# Patient Record
Sex: Female | Born: 1935 | Race: White | Hispanic: No | Smoking: Never smoker
Health system: Southern US, Community
[De-identification: ages and names within clinical notes are randomized; demographics above are authoritative.]

## PROBLEM LIST (undated history)

## (undated) DIAGNOSIS — E039 Hypothyroidism, unspecified: Secondary | ICD-10-CM

## (undated) DIAGNOSIS — N390 Urinary tract infection, site not specified: Secondary | ICD-10-CM

## (undated) DIAGNOSIS — N184 Chronic kidney disease, stage 4 (severe): Secondary | ICD-10-CM

## (undated) DIAGNOSIS — I5030 Unspecified diastolic (congestive) heart failure: Secondary | ICD-10-CM

## (undated) DIAGNOSIS — D638 Anemia in other chronic diseases classified elsewhere: Secondary | ICD-10-CM

## (undated) DIAGNOSIS — I10 Essential (primary) hypertension: Secondary | ICD-10-CM

## (undated) HISTORY — DX: Hypothyroidism, unspecified: E03.9

## (undated) HISTORY — PX: BACK SURGERY: SHX140

## (undated) HISTORY — DX: Unspecified diastolic (congestive) heart failure: I50.30

## (undated) HISTORY — PX: CHOLECYSTECTOMY: SHX55

## (undated) HISTORY — PX: THYROIDECTOMY: SHX17

## (undated) HISTORY — DX: Urinary tract infection, site not specified: N39.0

## (undated) HISTORY — DX: Chronic kidney disease, stage 4 (severe): N18.4

## (undated) HISTORY — PX: TONSILLECTOMY: SUR1361

## (undated) HISTORY — PX: OTHER SURGICAL HISTORY: SHX169

## (undated) HISTORY — PX: APPENDECTOMY: SHX54

## (undated) HISTORY — DX: Anemia in other chronic diseases classified elsewhere: D63.8

## (undated) HISTORY — PX: TOTAL HIP ARTHROPLASTY: SHX124

---

## 2012-02-05 DIAGNOSIS — R55 Syncope and collapse: Secondary | ICD-10-CM | POA: Insufficient documentation

## 2012-02-05 DIAGNOSIS — I10 Essential (primary) hypertension: Secondary | ICD-10-CM | POA: Diagnosis present

## 2015-01-19 DIAGNOSIS — N390 Urinary tract infection, site not specified: Secondary | ICD-10-CM | POA: Diagnosis present

## 2015-01-19 DIAGNOSIS — M6281 Muscle weakness (generalized): Secondary | ICD-10-CM | POA: Insufficient documentation

## 2015-01-24 DIAGNOSIS — R079 Chest pain, unspecified: Secondary | ICD-10-CM | POA: Insufficient documentation

## 2015-03-04 DIAGNOSIS — J4520 Mild intermittent asthma, uncomplicated: Secondary | ICD-10-CM | POA: Insufficient documentation

## 2015-03-16 DIAGNOSIS — M533 Sacrococcygeal disorders, not elsewhere classified: Secondary | ICD-10-CM | POA: Insufficient documentation

## 2015-05-18 DIAGNOSIS — M7061 Trochanteric bursitis, right hip: Secondary | ICD-10-CM | POA: Insufficient documentation

## 2015-09-14 DIAGNOSIS — L570 Actinic keratosis: Secondary | ICD-10-CM | POA: Insufficient documentation

## 2015-12-10 ENCOUNTER — Encounter: Payer: Self-pay | Admitting: Emergency Medicine

## 2015-12-10 ENCOUNTER — Emergency Department
Admission: EM | Admit: 2015-12-10 | Discharge: 2015-12-10 | Disposition: A | Discharge status: Home / Self Care | Payer: Medicare Other | Attending: Emergency Medicine | Admitting: Emergency Medicine

## 2015-12-10 DIAGNOSIS — I10 Essential (primary) hypertension: Secondary | ICD-10-CM | POA: Diagnosis not present

## 2015-12-10 DIAGNOSIS — R05 Cough: Secondary | ICD-10-CM | POA: Diagnosis present

## 2015-12-10 DIAGNOSIS — E876 Hypokalemia: Secondary | ICD-10-CM | POA: Diagnosis not present

## 2015-12-10 DIAGNOSIS — K591 Functional diarrhea: Secondary | ICD-10-CM

## 2015-12-10 DIAGNOSIS — J189 Pneumonia, unspecified organism: Secondary | ICD-10-CM | POA: Diagnosis not present

## 2015-12-10 DIAGNOSIS — E860 Dehydration: Secondary | ICD-10-CM | POA: Diagnosis not present

## 2015-12-10 HISTORY — DX: Essential (primary) hypertension: I10

## 2015-12-10 LAB — CBC WITH DIFFERENTIAL/PLATELET
BASOS ABS: 0.1 10*3/uL (ref 0–0.1)
Basophils Relative: 1 %
EOS ABS: 0.4 10*3/uL (ref 0–0.7)
HCT: 35.3 % (ref 35.0–47.0)
Hemoglobin: 11.7 g/dL — ABNORMAL LOW (ref 12.0–16.0)
Lymphocytes Relative: 8 %
Lymphs Abs: 0.6 10*3/uL — ABNORMAL LOW (ref 1.0–3.6)
MCH: 28.7 pg (ref 26.0–34.0)
MCHC: 33.2 g/dL (ref 32.0–36.0)
MCV: 86.5 fL (ref 80.0–100.0)
Monocytes Absolute: 0.8 10*3/uL (ref 0.2–0.9)
Monocytes Relative: 10 %
Neutro Abs: 5.9 10*3/uL (ref 1.4–6.5)
Neutrophils Relative %: 75 %
PLATELETS: 190 10*3/uL (ref 150–440)
RBC: 4.09 MIL/uL (ref 3.80–5.20)
RDW: 15.4 % — AB (ref 11.5–14.5)
WBC: 7.7 10*3/uL (ref 3.6–11.0)

## 2015-12-10 LAB — COMPREHENSIVE METABOLIC PANEL
ALT: 16 U/L (ref 14–54)
AST: 26 U/L (ref 15–41)
Albumin: 3.3 g/dL — ABNORMAL LOW (ref 3.5–5.0)
Alkaline Phosphatase: 41 U/L (ref 38–126)
Anion gap: 13 (ref 5–15)
BUN: 51 mg/dL — AB (ref 6–20)
CHLORIDE: 99 mmol/L — AB (ref 101–111)
CO2: 25 mmol/L (ref 22–32)
CREATININE: 1.82 mg/dL — AB (ref 0.44–1.00)
Calcium: 8.5 mg/dL — ABNORMAL LOW (ref 8.9–10.3)
GFR calc Af Amer: 27 mL/min — ABNORMAL LOW (ref >60)
GFR calc non Af Amer: 23 mL/min — ABNORMAL LOW (ref >60)
Glucose, Bld: 102 mg/dL — ABNORMAL HIGH (ref 65–99)
POTASSIUM: 3 mmol/L — AB (ref 3.5–5.1)
SODIUM: 137 mmol/L (ref 135–145)
Total Bilirubin: 0.9 mg/dL (ref 0.3–1.2)
Total Protein: 6.8 g/dL (ref 6.5–8.1)

## 2015-12-10 MED ORDER — SODIUM CHLORIDE 0.9 % IV BOLUS (SEPSIS)
500.0000 mL | Freq: Once | INTRAVENOUS | Status: AC
  Administered 2015-12-10: 500 mL via INTRAVENOUS

## 2015-12-10 MED ORDER — ALBUTEROL SULFATE (2.5 MG/3ML) 0.083% IN NEBU
2.5000 mg | INHALATION_SOLUTION | Freq: Once | RESPIRATORY_TRACT | Status: AC
  Administered 2015-12-10: 2.5 mg via RESPIRATORY_TRACT
  Filled 2015-12-10: qty 3

## 2015-12-10 MED ORDER — ONDANSETRON 4 MG PO TBDP: 4.0000 mg | ORAL_TABLET | Freq: Three times a day (TID) | ORAL | Status: DC | PRN

## 2015-12-10 MED ORDER — POTASSIUM CHLORIDE CRYS ER 20 MEQ PO TBCR
40.0000 meq | EXTENDED_RELEASE_TABLET | Freq: Once | ORAL | Status: AC
  Administered 2015-12-10: 40 meq via ORAL
  Filled 2015-12-10: qty 2

## 2015-12-10 MED ORDER — GUAIFENESIN 100 MG/5ML PO SOLN: 5.0000 mL | ORAL | Status: DC | PRN

## 2015-12-10 NOTE — Discharge Instructions (Signed)
Community-Acquired Pneumonia, Adult Pneumonia is an infection of the lungs. There are different types of pneumonia. One type can develop while a person is in a hospital. A different type, called community-acquired pneumonia, develops in people who are not, or have not recently been, in the hospital or other health care facility.  CAUSES Pneumonia may be caused by bacteria, viruses, or funguses. Community-acquired pneumonia is often caused by Streptococcus pneumonia bacteria. These bacteria are often passed from one person to another by breathing in droplets from the cough or sneeze of an infected person. RISK FACTORS The condition is more likely to develop in:  People who havechronic diseases, such as chronic obstructive pulmonary disease (COPD), asthma, congestive heart failure, cystic fibrosis, diabetes, or kidney disease.  People who haveearly-stage or late-stage HIV.  People who havesickle cell disease.  People who havehad their spleen removed (splenectomy).  People who havepoor Human resources officer.  People who havemedical conditions that increase the risk of breathing in (aspirating) secretions their own mouth and nose.   People who havea weakened immune system (immunocompromised).  People who smoke.  People whotravel to areas where pneumonia-causing germs commonly exist.  People whoare around animal habitats or animals that have pneumonia-causing germs, including birds, bats, rabbits, cats, and farm animals. SYMPTOMS Symptoms of this condition include:  Adry cough.  A wet (productive) cough.  Fever.  Sweating.  Chest pain, especially when breathing deeply or coughing.  Rapid breathing or difficulty breathing.  Shortness of breath.  Shaking chills.  Fatigue.  Muscle aches. DIAGNOSIS Your health care provider will take a medical history and perform a physical exam. You may also have other tests, including:  Imaging studies of your chest, including  X-rays.  Tests to check your blood oxygen level and other blood gases.  Other tests on blood, mucus (sputum), fluid around your lungs (pleural fluid), and urine. If your pneumonia is severe, other tests may be done to identify the specific cause of your illness. TREATMENT The type of treatment that you receive depends on many factors, such as the cause of your pneumonia, the medicines you take, and other medical conditions that you have. For most adults, treatment and recovery from pneumonia may occur at home. In some cases, treatment must happen in a hospital. Treatment may include:  Antibiotic medicines, if the pneumonia was caused by bacteria.  Antiviral medicines, if the pneumonia was caused by a virus.  Medicines that are given by mouth or through an IV tube.  Oxygen.  Respiratory therapy. Although rare, treating severe pneumonia may include:  Mechanical ventilation. This is done if you are not breathing well on your own and you cannot maintain a safe blood oxygen level.  Thoracentesis. This procedureremoves fluid around one lung or both lungs to help you breathe better. HOME CARE INSTRUCTIONS  Take over-the-counter and prescription medicines only as told by your health care provider.  Only takecough medicine if you are losing sleep. Understand that cough medicine can prevent your body's natural ability to remove mucus from your lungs.  If you were prescribed an antibiotic medicine, take it as told by your health care provider. Do not stop taking the antibiotic even if you start to feel better.  Sleep in a semi-upright position at night. Try sleeping in a reclining chair, or place a few pillows under your head.  Do not use tobacco products, including cigarettes, chewing tobacco, and e-cigarettes. If you need help quitting, ask your health care provider.  Drink enough water to keep your urine  clear or pale yellow. This will help to thin out mucus secretions in your  lungs. PREVENTION There are ways that you can decrease your risk of developing community-acquired pneumonia. Consider getting a pneumococcal vaccine if:  You are older than 80 years of age.  You are older than 80 years of age and are undergoing cancer treatment, have chronic lung disease, or have other medical conditions that affect your immune system. Ask your health care provider if this applies to you. There are different types and schedules of pneumococcal vaccines. Ask your health care provider which vaccination option is best for you. You may also prevent community-acquired pneumonia if you take these actions:  Get an influenza vaccine every year. Ask your health care provider which type of influenza vaccine is best for you.  Go to the dentist on a regular basis.  Wash your hands often. Use hand sanitizer if soap and water are not available. SEEK MEDICAL CARE IF:  You have a fever.  You are losing sleep because you cannot control your cough with cough medicine. SEEK IMMEDIATE MEDICAL CARE IF:  You have worsening shortness of breath.  You have increased chest pain.  Your sickness becomes worse, especially if you are an older adult or have a weakened immune system.  You cough up blood.   This information is not intended to replace advice given to you by your health care provider. Make sure you discuss any questions you have with your health care provider.   Document Released: 07/23/2005 Document Revised: 04/13/2015 Document Reviewed: 11/17/2014 Elsevier Interactive Patient Education 2016 [employer]  Dehydration Dehydration is when you lose more fluids from the body than you take in. Vital organs such as the kidneys, brain, and heart cannot function without a proper amount of fluids and salt. Any loss of fluids from the body can cause dehydration.  Older adults are at a higher risk of dehydration than younger adults. As we age, our bodies are less able to conserve water  and do not respond to temperature changes as well. Also, older adults do not become thirsty as easily or quickly. Because of this, older adults often do not realize they need to increase fluids to avoid dehydration.  CAUSES   Vomiting.  Diarrhea.  Excessive sweating.  Excessive urination.  Fever.  Certain medicines, such as blood pressure medicines called diuretics.  Poorly controlled blood sugars. SIGNS AND SYMPTOMS  Mild dehydration:  Thirst.  Dry lips.  Slightly dry mouth. Moderate dehydration:  Very dry mouth.  Sunken eyes.  Skin does not bounce back quickly when lightly pinched and released.  Dark urine and decreased urine production.  Decreased tear production.  Headache. Severe dehydration:  Very dry mouth.  Extreme thirst.  Rapid, weak pulse (more than 100 beats per minute at rest).  Cold hands and feet.  Not able to sweat in spite of heat.  Rapid breathing.  Blue lips.  Confusion and lethargy.  Difficulty being awakened.  Minimal urine production.  No tears. DIAGNOSIS  Your health care provider will diagnose dehydration based on your symptoms and your exam. Blood and urine tests will help confirm the diagnosis. The diagnostic evaluation should also identify the cause of dehydration. TREATMENT  Treatment of mild or moderate dehydration can often be done at home by increasing the amount of fluids that you drink. It is best to drink small amounts of fluid more often. Drinking too much at one time can make vomiting worse. Severe dehydration needs to be treated  at the hospital. You may be given IV fluids that contain water and electrolytes. HOME CARE INSTRUCTIONS   Ask your health care provider about specific rehydration instructions.  Drink enough fluids to keep your urine clear or pale yellow.  Drink small amounts frequently if you have nausea and vomiting.  Eat as you normally do.  Avoid:  Foods or drinks high in sugar.  Carbonated  drinks.  Juice.  Extremely hot or cold fluids.  Drinks with caffeine.  Fatty, greasy foods.  Alcohol.  Tobacco.  Overeating.  Gelatin desserts.  Wash your hands well to avoid spreading bacteria and viruses.  Only take over-the-counter or prescription medicines for pain, discomfort, or fever as directed by your health care provider.  Ask your health care provider if you should continue all prescribed and over-the-counter medicines.  Keep all follow-up appointments with your health care provider. SEEK MEDICAL CARE IF:  You have abdominal pain, and it increases or stays in one area (localizes).  You have a rash, stiff neck, or severe headache.  You are irritable, sleepy, or difficult to awaken.  You are weak, dizzy, or extremely thirsty.  You have a fever. SEEK IMMEDIATE MEDICAL CARE IF:   You are unable to keep fluids down, or you get worse despite treatment.  You have frequent episodes of vomiting or diarrhea.  You have blood or green matter (bile) in your vomit.  You have blood in your stool, or your stool looks black and tarry.  You have not urinated in 6-8 hours, or you have only urinated a small amount of very dark urine.  You faint. MAKE SURE YOU:   Understand these instructions.  Will watch your condition.  Will get help right away if you are not doing well or get worse.   This information is not intended to replace advice given to you by your health care provider. Make sure you discuss any questions you have with your health care provider.   Document Released: 10/13/2003 Document Revised: 07/28/2013 Document Reviewed: 03/30/2013 Elsevier Interactive Patient Education 2016 [employer]  Diarrhea Diarrhea is watery poop (stool). It can make you feel weak, tired, thirsty, or give you a dry mouth (signs of dehydration). Watery poop is a sign of another problem, most often an infection. It often lasts 2-3 days. It can last longer if it is a sign of  something serious. Take care of yourself as told by your doctor. HOME CARE   Drink 1 cup (8 ounces) of fluid each time you have watery poop.  Do not drink the following fluids:  Those that contain simple sugars (fructose, glucose, galactose, lactose, sucrose, maltose).  Sports drinks.  Fruit juices.  Whole milk products.  Sodas.  Drinks with caffeine (coffee, tea, soda) or alcohol.  Oral rehydration solution may be used if the doctor says it is okay. You may make your own solution. Follow this recipe:   - teaspoon table salt.   teaspoon baking soda.   teaspoon salt substitute containing potassium chloride.  1 tablespoons sugar.  1 liter (34 ounces) of water.  Avoid the following foods:  High fiber foods, such as raw fruits and vegetables.  Nuts, seeds, and whole grain breads and cereals.   Those that are sweetened with sugar alcohols (xylitol, sorbitol, mannitol).  Try eating the following foods:  Starchy foods, such as rice, toast, pasta, low-sugar cereal, oatmeal, baked potatoes, crackers, and bagels.  Bananas.  Applesauce.  Eat probiotic-rich foods, such as yogurt and milk products that  are fermented.  Wash your hands well after each time you have watery poop.  Only take medicine as told by your doctor.  Take a warm bath to help lessen burning or pain from having watery poop. GET HELP RIGHT AWAY IF:   You cannot drink fluids without throwing up (vomiting).  You keep throwing up.  You have blood in your poop, or your poop looks black and tarry.  You do not pee (urinate) in 6-8 hours, or there is only a small amount of very dark pee.  You have belly (abdominal) pain that gets worse or stays in the same spot (localizes).  You are weak, dizzy, confused, or light-headed.  You have a very bad headache.  Your watery poop gets worse or does not get better.  You have a fever or lasting symptoms for more than 2-3 days.  You have a fever and your  symptoms suddenly get worse. MAKE SURE YOU:   Understand these instructions.  Will watch your condition.  Will get help right away if you are not doing well or get worse.   This information is not intended to replace advice given to you by your health care provider. Make sure you discuss any questions you have with your health care provider.   Document Released: 01/09/2008 Document Revised: 08/13/2014 Document Reviewed: 03/30/2012 Elsevier Interactive Patient Education 2016 [employer].  Hypokalemia Hypokalemia means that the amount of potassium in the blood is lower than normal.Potassium is a chemical, called an electrolyte, that helps regulate the amount of fluid in the body. It also stimulates muscle contraction and helps nerves function properly.Most of the body's potassium is inside of cells, and only a very small amount is in the blood. Because the amount in the blood is so small, minor changes can be life-threatening. CAUSES  Antibiotics.  Diarrhea or vomiting.  Using laxatives too much, which can cause diarrhea.  Chronic kidney disease.  Water pills (diuretics).  Eating disorders (bulimia).  Low magnesium level.  Sweating a lot. SIGNS AND SYMPTOMS  Weakness.  Constipation.  Fatigue.  Muscle cramps.  Mental confusion.  Skipped heartbeats or irregular heartbeat (palpitations).  Tingling or numbness. DIAGNOSIS  Your health care provider can diagnose hypokalemia with blood tests. In addition to checking your potassium level, your health care provider may also check other lab tests. TREATMENT Hypokalemia can be treated with potassium supplements taken by mouth or adjustments in your current medicines. If your potassium level is very low, you may need to get potassium through a vein (IV) and be monitored in the hospital. A diet high in potassium is also helpful. Foods high in potassium are:  Nuts, such as peanuts and pistachios.  Seeds, such as sunflower  seeds and pumpkin seeds.  Peas, lentils, and lima beans.  Whole grain and bran cereals and breads.  Fresh fruit and vegetables, such as apricots, avocado, bananas, cantaloupe, kiwi, oranges, tomatoes, asparagus, and potatoes.  Orange and tomato juices.  Red meats.  Fruit yogurt. HOME CARE INSTRUCTIONS  Take all medicines as prescribed by your health care provider.  Maintain a healthy diet by including nutritious food, such as fruits, vegetables, nuts, whole grains, and lean meats.  If you are taking a laxative, be sure to follow the directions on the label. SEEK MEDICAL CARE IF:  Your weakness gets worse.  You feel your heart pounding or racing.  You are vomiting or having diarrhea.  You are diabetic and having trouble keeping your blood glucose in the normal  range. SEEK IMMEDIATE MEDICAL CARE IF:  You have chest pain, shortness of breath, or dizziness.  You are vomiting or having diarrhea for more than 2 days.  You faint. MAKE SURE YOU:   Understand these instructions.  Will watch your condition.  Will get help right away if you are not doing well or get worse.   This information is not intended to replace advice given to you by your health care provider. Make sure you discuss any questions you have with your health care provider.   Document Released: 07/23/2005 Document Revised: 08/13/2014 Document Reviewed: 01/23/2013 Elsevier Interactive Patient Education 2016 [employer] Inc[employer]  Rehydration, Elderly Rehydration is the replacement of body fluids lost during dehydration. Dehydration is an extreme loss of body fluids to the point of body function impairment. There are many ways extreme fluid loss can occur, including vomiting, diarrhea, or excess sweating. Recovering from dehydration requires replacing lost fluids, continuing to eat to maintain strength, and avoiding foods and beverages that may contribute to further fluid loss or may increase nausea. It is especially  important for older adults to stay hydrated because dehydration can lead to dizziness and falls. Some factors that can contribute to dehydration are more common in the elderly, including the use of prescription medicines and a decreased sensation of thirst.  HOW TO REHYDRATE In most cases, rehydration involves the replacement of not only fluids but also carbohydrates and basic body salts. Rehydration with an oral rehydration solution is one way to replace essential nutrients lost through dehydration. An oral rehydration solution can be purchased at pharmacies, retail stores, and online. Premixed packets of powder that you combine with water to make a solution are also sold. You can prepare an oral rehydration solution at home by mixing the following ingredients together:    - tsp table salt.   tsp baking soda.   tsp salt substitute containing potassium chloride.  1 tablespoons sugar.  1 L (34 oz) of water. Be sure to use exact measurements. Including too much sugar can make diarrhea worse. Drink -1 cup (120-240 mL) of oral rehydration solution each time you have diarrhea or vomit. If drinking this amount makes your vomiting worse, try drinking smaller amounts more often. For example, drink 1-3 tsp every 5-10 minutes.  A general rule for staying hydrated is to drink 1-2 L of fluid per day. Talk to your caregiver about the specific amount you should be drinking each day. Drink enough fluids to keep your urine clear or pale yellow. EATING WHEN DEHYDRATED  Even if you have had severe sweating or you are having diarrhea, do not stop eating. Many healthy items in a normal diet are okay to continue eating while recovering from dehydration. The following tips can help you to lessen nausea when you eat:  Ask someone else to prepare your food. Cooking smells may worsen nausea.  Eat in a well-ventilated room away from cooking smells.  Sit up when you eat. Avoid lying down until 1-2 hours after  eating.  Eat small amounts when you eat.  Eat foods that are easy to digest. These include soft, well-cooked, or mashed foods. FOODS AND BEVERAGES TO AVOID Avoid eating or drinking the following foods and beverages that may increase nausea or further loss of fluid:   Fruit juices with a high sugar content, such as concentrated juices.  Alcohol.  Beverages containing caffeine.  Carbonated drinks. They may cause a lot of gas.  Foods that may cause a lot of gas, such  as cabbage, broccoli, and beans.  Fatty, greasy, and fried foods.  Spicy, very salty, and very sweet foods or drinks.  Foods or drinks that are very hot or very cold. Consume food or drinks at or near room temperature.  Foods that need a lot of chewing, such as raw vegetables.  Foods that are sticky or hard to swallow, such as peanut butter.   This information is not intended to replace advice given to you by your health care provider. Make sure you discuss any questions you have with your health care provider.   Document Released: 10/15/2011 Document Revised: 04/16/2012 Document Reviewed: 10/15/2011 Elsevier Interactive Patient Education [employer]

## 2015-12-10 NOTE — ED Notes (Signed)
Pt and family verbalized understanding of discharge instructions. NAD at this time.  

## 2015-12-10 NOTE — ED Provider Notes (Signed)
[facility] Emergency Department Provider Note  ____________________________________________  Time seen: 10:40 AM  I have reviewed the triage vital signs and the nursing notes.   HISTORY  Chief Complaint Cough    HPI Julie Potts is a 82 y.o. female who complains of productive cough and diarrhea.She was seen a primary care 3 days ago and again yesterday for this. She was started on Augmentin. She went to [facility] emergency room yesterday for evaluation, but left without being seen after a 7 hour wait. She did have labs and a chest x-ray performed while waiting in triage. I have reviewed these in the electronic medical record which shows some mild acute renal insufficiency with a creatinine of 1.7 on a baseline of 1.2 and her chest x-ray was unremarkable.  She denies fevers or chills and states that with the Augmentin she overall feels much better. Her doctor also prescribed her an albuterol inhaler. Does not have significant chest pain or shortness of breath at this time but does just still have a nagging cough. Her bigger concern is that with the Augmentin she's been having a lot of diarrhea and feels that she is getting dehydrated. She is eating and drinking normally. Denies abdominal pain.     Past Medical History  Diagnosis Date  . Hypertension      There are no active problems to display for this patient.    Past Surgical History  Procedure Laterality Date  . Cholecystectomy    . Thyroidectomy    . Knee repacement    . Total hip arthroplasty    . Appendectomy    . Tonsillectomy    . Back surgery       Current Outpatient Rx  Name  Route  Sig  Dispense  Refill  . ondansetron (ZOFRAN ODT) 4 MG disintegrating tablet   Oral   Take 1 tablet (4 mg total) by mouth every 8 (eight) hours as needed for nausea or vomiting.   20 tablet   0      Allergies Ciprofloxacin; Levofloxacin; and Sulfa antibiotics   No family history on file.  Social  History Social History  Substance Use Topics  . Smoking status: Never Smoker   . Smokeless tobacco: None  . Alcohol Use: No    Review of Systems  Constitutional:   No fever or chills.  Eyes:   No vision changes.  ENT:   No sore throat. No rhinorrhea. Cardiovascular:   No chest pain. Respiratory:   No dyspnea Positive productive cough. Gastrointestinal:   Negative for abdominal pain, no vomiting, positive diarrhea.  Genitourinary:   Negative for dysuria or difficulty urinating. Musculoskeletal:   Negative for focal pain or swelling Neurological:   Negative for headaches 10-point ROS otherwise negative.  ____________________________________________   PHYSICAL EXAM:  VITAL SIGNS: ED Triage Vitals  Enc Vitals Group     BP 12/10/15 0957 132/53 mmHg     Pulse Rate 12/10/15 0957 66     Resp 12/10/15 0957 20     Temp 12/10/15 0957 98 F (36.7 C)     Temp Source 12/10/15 0957 Oral     SpO2 12/10/15 0957 93 %     Weight 12/10/15 0957 150 lb (68.04 kg)     Height 12/10/15 0957  (1.626 m)     Head Cir --      Peak Flow --      Pain Score --      Pain Loc --  Pain Edu? --      Excl. in GC? --     Vital signs reviewed, nursing assessments reviewed.   Constitutional:   Alert and oriented. Well appearing and in no distress. Eyes:   No scleral icterus. No conjunctival pallor. PERRL. EOMI.  No nystagmus. ENT   Head:   Normocephalic and atraumatic.   Nose:   No congestion/rhinnorhea. No septal hematoma   Mouth/Throat:   Dry mucous membranes, no pharyngeal erythema. No peritonsillar mass.    Neck:   No stridor. No SubQ emphysema. No meningismus. Hematological/Lymphatic/Immunilogical:   No cervical lymphadenopathy. Cardiovascular:   RRR. Symmetric bilateral radial and DP pulses.  No murmurs.  Respiratory:   Normal respiratory effort without tachypnea nor retractions. Breath sounds are clear and equal bilaterally. No wheezes/rales/rhonchi. Gastrointestinal:    Soft and nontender. Non distended. There is no CVA tenderness.  No rebound, rigidity, or guarding. Genitourinary:   deferred Musculoskeletal:   Nontender with normal range of motion in all extremities. No joint effusions.  No lower extremity tenderness.  No edema. Neurologic:   Normal speech and language.  CN 2-10 normal. Motor grossly intact. No gross focal neurologic deficits are appreciated.  Skin:    Skin is warm, dry and intact. No rash noted.  No petechiae, purpura, or bullae. Poor skin turgor  ____________________________________________    LABS (pertinent positives/negatives) (all labs ordered are listed, but only abnormal results are displayed) Labs Reviewed  CBC WITH DIFFERENTIAL/PLATELET - Abnormal; Notable for the following:    Hemoglobin 11.7 (*)    RDW 15.4 (*)    Lymphs Abs 0.6 (*)    All other components within normal limits  COMPREHENSIVE METABOLIC PANEL - Abnormal; Notable for the following:    Potassium 3.0 (*)    Chloride 99 (*)    Glucose, Bld 102 (*)    BUN 51 (*)    Creatinine, Ser 1.82 (*)    Calcium 8.5 (*)    Albumin 3.3 (*)    GFR calc non Af Amer 23 (*)    GFR calc Af Amer 27 (*)    All other components within normal limits   ____________________________________________   EKG    ____________________________________________    RADIOLOGY    ____________________________________________   PROCEDURES   ____________________________________________   INITIAL IMPRESSION / ASSESSMENT AND PLAN / ED COURSE  Pertinent labs & imaging results that were available during my care of the patient were reviewed by me and considered in my medical decision making (see chart for details).  Patient well appearing no acute distress. Given IV fluids and 1 albuterol treatment here. Repeat labs show that her white blood cell count is gone from 11 to 7 in 24 hours. Chemistry panel is stable with a creatinine of 1.8. Potassium is also stable at 3.0. I gave  her oral potassium. After fluids, she feels much better and is tolerating oral intake and wants to go home. Counseled then to continue the Augmentin. She has a follow-up appointment with her primary care doctor in 2 days. Low suspicion for sepsis, pneumothorax dissection ACS PE. Plan for discharge home.     ____________________________________________   FINAL CLINICAL IMPRESSION(S) / ED DIAGNOSES  Final diagnoses:  Community acquired pneumonia  Functional diarrhea  Dehydration  Hypokalemia       Portions of this note were generated with dragon dictation software. Dictation errors may occur despite best attempts at proofreading.   Sharman CheekPhillip Tarin Johndrow, MD 12/10/15 [phone]

## 2015-12-10 NOTE — ED Notes (Signed)
Cough x 1 week, congested cough noted at triage. States has [facility] x [facility] yesterday but did not stay to get results.

## 2016-05-04 DIAGNOSIS — K638219 Small intestinal bacterial overgrowth, unspecified: Secondary | ICD-10-CM | POA: Insufficient documentation

## 2017-02-12 DIAGNOSIS — E039 Hypothyroidism, unspecified: Secondary | ICD-10-CM | POA: Insufficient documentation

## 2017-02-12 DIAGNOSIS — N183 Chronic kidney disease, stage 3 unspecified: Secondary | ICD-10-CM | POA: Insufficient documentation

## 2017-06-09 ENCOUNTER — Emergency Department: Payer: Medicare Other

## 2017-06-09 ENCOUNTER — Encounter: Payer: Self-pay | Admitting: Emergency Medicine

## 2017-06-09 ENCOUNTER — Emergency Department
Admission: EM | Admit: 2017-06-09 | Discharge: 2017-06-09 | Disposition: A | Discharge status: Home / Self Care | Payer: Medicare Other | Attending: Emergency Medicine | Admitting: Emergency Medicine

## 2017-06-09 DIAGNOSIS — Y9389 Activity, other specified: Secondary | ICD-10-CM | POA: Insufficient documentation

## 2017-06-09 DIAGNOSIS — Y92410 Unspecified street and highway as the place of occurrence of the external cause: Secondary | ICD-10-CM | POA: Insufficient documentation

## 2017-06-09 DIAGNOSIS — Y999 Unspecified external cause status: Secondary | ICD-10-CM | POA: Insufficient documentation

## 2017-06-09 DIAGNOSIS — S0990XA Unspecified injury of head, initial encounter: Secondary | ICD-10-CM

## 2017-06-09 DIAGNOSIS — W1789XA Other fall from one level to another, initial encounter: Secondary | ICD-10-CM | POA: Insufficient documentation

## 2017-06-09 DIAGNOSIS — Z96649 Presence of unspecified artificial hip joint: Secondary | ICD-10-CM | POA: Insufficient documentation

## 2017-06-09 DIAGNOSIS — I10 Essential (primary) hypertension: Secondary | ICD-10-CM | POA: Diagnosis not present

## 2017-06-09 DIAGNOSIS — Z79899 Other long term (current) drug therapy: Secondary | ICD-10-CM | POA: Diagnosis not present

## 2017-06-09 DIAGNOSIS — S0101XA Laceration without foreign body of scalp, initial encounter: Secondary | ICD-10-CM | POA: Insufficient documentation

## 2017-06-09 NOTE — ED Triage Notes (Signed)
Patient presents to the ED with fall around 2:30pm.  Patient reports tripping on the curb.  Denies dizziness before or after.  Patient denies losing consciousness.  Patient reports hitting the back of her head and reports a small laceration.  Patient reports minimal pain.  Patient ambulatory to registration with cane which she uses at baseline, with steady gait.  Patient is in no obvious distress at this time.

## 2017-06-09 NOTE — ED Notes (Signed)
ED Provider at bedside. 

## 2017-06-09 NOTE — ED Provider Notes (Signed)
[facility] Emergency Department Provider Note  ____________________________________________   First MD Initiated Contact with Patient 06/09/17 1946     (approximate)  I have reviewed the triage vital signs and the nursing notes.   HISTORY  Chief Complaint Fall   HPI Julie Potts is a 83 y.o. female with a history of hypertension not on any blood thinners was presented to the emergency department after a fall.  Says that earlier this afternoon she was coming out of a truck and she felt the ground was level but instead there was a curb and she fell backward hitting the back of her head.  She did not lose consciousness but she did sustain a laceration.  She is not claiming any nausea, vomiting or dizziness at this time.  Came in with her grandson for concern for the laceration sustained during the fall.  Past Medical History:  Diagnosis Date  . Hypertension     There are no active problems to display for this patient.   Past Surgical History:  Procedure Laterality Date  . APPENDECTOMY    . BACK SURGERY    . CHOLECYSTECTOMY    . knee repacement    . THYROIDECTOMY    . TONSILLECTOMY    . TOTAL HIP ARTHROPLASTY      Prior to Admission medications   Medication Sig Start Date End Date Taking? Authorizing Provider  guaiFENesin (ROBITUSSIN) 100 MG/5ML SOLN Take 5 mLs (100 mg total) by mouth every 4 (four) hours as needed for cough or to loosen phlegm. 12/10/15   Sharman CheekStafford, Phillip, MD  ondansetron (ZOFRAN ODT) 4 MG disintegrating tablet Take 1 tablet (4 mg total) by mouth every 8 (eight) hours as needed for nausea or vomiting. 12/10/15   Sharman CheekStafford, Phillip, MD    Allergies Ciprofloxacin; Levofloxacin; and Sulfa antibiotics  No family history on file.  Social History Social History   Tobacco Use  . Smoking status: Never Smoker  . Smokeless tobacco: Never Used  Substance Use Topics  . Alcohol use: No  . Drug use: Not on file    Review of  Systems  Constitutional: No fever/chills Eyes: No visual changes. ENT: No sore throat. Cardiovascular: Denies chest pain. Respiratory: Denies shortness of breath. Gastrointestinal: No abdominal pain.  No nausea, no vomiting.  No diarrhea.  No constipation. Genitourinary: Negative for dysuria. Musculoskeletal: Negative for back pain. Skin: Negative for rash. Neurological: Negative for headaches, focal weakness or numbness.   ____________________________________________   PHYSICAL EXAM:  VITAL SIGNS: ED Triage Vitals  Enc Vitals Group     BP 06/09/17 1843 (!) 153/53     Pulse Rate 06/09/17 1843 76     Resp 06/09/17 1843 18     Temp 06/09/17 1843 97.7 F (36.5 C)     Temp Source 06/09/17 1843 Oral     SpO2 06/09/17 1843 98 %     Weight 06/09/17 1834 154 lb (69.9 kg)     Height 06/09/17 1834 5\' 3"  (1.6 m)     Head Circumference --      Peak Flow --      Pain Score 06/09/17 1834 3     Pain Loc --      Pain Edu? --      Excl. in GC? --     Constitutional: Alert and oriented. Well appearing and in no acute distress. Eyes: Conjunctivae are normal.  Head: Left occipital parietal 1 cm horizontal laceration that is about 2-3 cm deep without any active  bleeding at this time.  Mild surrounding swelling without any bogginess.  No depression. Nose: No congestion/rhinnorhea. Mouth/Throat: Mucous membranes are moist.  Neck: No stridor.   Cardiovascular: Normal rate, regular rhythm. Grossly normal heart sounds.   Respiratory: Normal respiratory effort.  No retractions. Lungs CTAB. Gastrointestinal: Soft and nontender. No distention.  Musculoskeletal: No lower extremity tenderness nor edema.  No joint effusions. Neurologic:  Normal speech and language. No gross focal neurologic deficits are appreciated. Skin:  Skin is warm, dry and intact. No rash noted. Psychiatric: Mood and affect are normal. Speech and behavior are normal.  ____________________________________________    LABS (all labs ordered are listed, but only abnormal results are displayed)  Labs Reviewed - No data to display ____________________________________________  EKG   ____________________________________________  RADIOLOGY  No acute intracranial abnormality. ____________________________________________   PROCEDURES  Procedure(s) performed:   LACERATION REPAIR Performed by: Arelia Longest Authorized by: Arelia Longest Consent: Verbal consent obtained. Risks and benefits: risks, benefits and alternatives were discussed Consent given by: patient Patient identity confirmed: provided demographic data Prepped and Draped in normal sterile fashion Wound explored  Laceration Location: Left parietal  Laceration Length: 1.5cm  No Foreign Bodies seen or palpated   Irrigation method: syringe Amount of cleaning: standard  Skin closure: Dermabond  Patient tolerance: Patient tolerated the procedure well with no immediate complications.   Procedures  Critical Care performed:   ____________________________________________   INITIAL IMPRESSION / ASSESSMENT AND PLAN / ED COURSE  Pertinent labs & imaging results that were available during my care of the patient were reviewed by me and considered in my medical decision making (see chart for details).  DDX: Skull fracture, laceration, intracranial hemorrhage, concussion  As part of my medical decision making, I reviewed the following data within the electronic MEDICAL RECORD NUMBER Notes from prior ED visits  Wound repaired with Dermabond.  I gave the family the option to leave it open completely but the patient and the family would like it repaired.  Wound is within the hairline and well approximated.  Good cosmesis and well tolerated by the patient.        ____________________________________________   FINAL CLINICAL IMPRESSION(S) / ED DIAGNOSES  Head injury.  Scalp laceration.    NEW MEDICATIONS STARTED  DURING THIS VISIT:  This SmartLink is deprecated. Use AVSMEDLIST instead to display the medication list for a patient.   Note:  This document was prepared using Dragon voice recognition software and may include unintentional dictation errors.     Myrna Blazer, MD 06/09/17 2006

## 2017-06-09 NOTE — ED Notes (Signed)
MD at bedside with derma-bond and supplies

## 2017-10-31 DIAGNOSIS — M5417 Radiculopathy, lumbosacral region: Secondary | ICD-10-CM | POA: Insufficient documentation

## 2018-02-25 DIAGNOSIS — I5030 Unspecified diastolic (congestive) heart failure: Secondary | ICD-10-CM | POA: Diagnosis present

## 2019-04-23 ENCOUNTER — Emergency Department: Payer: Medicare Other

## 2019-04-23 ENCOUNTER — Other Ambulatory Visit: Payer: Self-pay

## 2019-04-23 ENCOUNTER — Emergency Department
Admission: EM | Admit: 2019-04-23 | Discharge: 2019-04-23 | Disposition: A | Discharge status: Home / Self Care | Payer: Medicare Other | Attending: Student in an Organized Health Care Education/Training Program | Admitting: Student in an Organized Health Care Education/Training Program

## 2019-04-23 DIAGNOSIS — Y999 Unspecified external cause status: Secondary | ICD-10-CM | POA: Insufficient documentation

## 2019-04-23 DIAGNOSIS — Y93E9 Activity, other interior property and clothing maintenance: Secondary | ICD-10-CM | POA: Insufficient documentation

## 2019-04-23 DIAGNOSIS — S0101XA Laceration without foreign body of scalp, initial encounter: Secondary | ICD-10-CM

## 2019-04-23 DIAGNOSIS — W19XXXA Unspecified fall, initial encounter: Secondary | ICD-10-CM

## 2019-04-23 DIAGNOSIS — I10 Essential (primary) hypertension: Secondary | ICD-10-CM | POA: Insufficient documentation

## 2019-04-23 DIAGNOSIS — Y92009 Unspecified place in unspecified non-institutional (private) residence as the place of occurrence of the external cause: Secondary | ICD-10-CM | POA: Diagnosis not present

## 2019-04-23 DIAGNOSIS — S0990XA Unspecified injury of head, initial encounter: Secondary | ICD-10-CM | POA: Diagnosis present

## 2019-04-23 DIAGNOSIS — W01190A Fall on same level from slipping, tripping and stumbling with subsequent striking against furniture, initial encounter: Secondary | ICD-10-CM | POA: Diagnosis not present

## 2019-04-23 MED ORDER — LIDOCAINE-EPINEPHRINE-TETRACAINE (LET) SOLUTION
3.0000 mL | Freq: Once | NASAL | Status: AC
  Administered 2019-04-23: 3 mL via TOPICAL
  Filled 2019-04-23: qty 3

## 2019-04-23 MED ORDER — TETANUS-DIPHTH-ACELL PERTUSSIS 5-2.5-18.5 LF-MCG/0.5 IM SUSP
0.5000 mL | Freq: Once | INTRAMUSCULAR | Status: DC
  Filled 2019-04-23: qty 0.5

## 2019-04-23 MED ORDER — LIDOCAINE HCL (PF) 1 % IJ SOLN
5.0000 mL | Freq: Once | INTRAMUSCULAR | Status: AC
  Administered 2019-04-23: 5 mL via INTRADERMAL
  Filled 2019-04-23: qty 5

## 2019-04-23 NOTE — ED Triage Notes (Signed)
Pt to ED via [facility] from [facility] after mechanical fall. Pt arrives with puncture like appearing wound to posterior head, dried blood. Pt reports trip over pillow while making the bed, hitting head on nightstand knob. Assisted up by staff. No LOC. Denies use of blood thinners. No other injuries. Denies neck or back pain. Pt alert and oriented X4, cooperative, RR even and unlabored, color WNL. Pt in NAD.

## 2019-04-23 NOTE — ED Notes (Signed)
Patient transported to CT 

## 2019-04-23 NOTE — Discharge Instructions (Addendum)
Please apply triple antibiotic ointment / bacitracin twice daily to the scalp.  Staples will need to be removed in 6 to 7 days.  Return for any fevers or drainage the wound.  Follow-up with PCP.

## 2019-04-23 NOTE — ED Provider Notes (Signed)
[facility] Emergency Department Provider Note    First MD Initiated Contact with Patient 04/23/19 0915     (approximate)  I have reviewed the triage vital signs and the nursing notes.   HISTORY  Chief Complaint Fall    HPI Julie Potts is a 85 y.o. female below listed past medical history not on any blood thinners presents the ER after mechanical fall this morning.  She was making her bed and stepped on a pillow losing her balance falling and hitting the back of her head on her dresser knob.  There is no LOC.  Denies any other injury.  Was brought to the ER due to headache bleeding neck discomfort.  States his symptoms are mild at this point.    Past Medical History:  Diagnosis Date  . Hypertension    No family history on file. Past Surgical History:  Procedure Laterality Date  . APPENDECTOMY    . BACK SURGERY    . CHOLECYSTECTOMY    . knee repacement    . THYROIDECTOMY    . TONSILLECTOMY    . TOTAL HIP ARTHROPLASTY     There are no active problems to display for this patient.     Prior to Admission medications   Medication Sig Start Date End Date Taking? Authorizing Provider  guaiFENesin (ROBITUSSIN) 100 MG/5ML SOLN Take 5 mLs (100 mg total) by mouth every 4 (four) hours as needed for cough or to loosen phlegm. 12/10/15   Sharman CheekStafford, Phillip, MD  ondansetron (ZOFRAN ODT) 4 MG disintegrating tablet Take 1 tablet (4 mg total) by mouth every 8 (eight) hours as needed for nausea or vomiting. 12/10/15   Sharman CheekStafford, Phillip, MD    Allergies Ciprofloxacin, Levofloxacin, and Sulfa antibiotics    Social History Social History   Tobacco Use  . Smoking status: Never Smoker  . Smokeless tobacco: Never Used  Substance Use Topics  . Alcohol use: No  . Drug use: Not on file    Review of Systems Patient denies headaches, rhinorrhea, blurry vision, numbness, shortness of breath, chest pain, edema, cough, abdominal pain, nausea, vomiting, diarrhea,  dysuria, fevers, rashes or hallucinations unless otherwise stated above in HPI. ____________________________________________   PHYSICAL EXAM:  VITAL SIGNS: Vitals:   04/23/19 0913 04/23/19 1049  BP: (!) 177/72 (!) 156/57  Pulse: 72 72  Resp: 16 18  Temp: 98.3 F (36.8 C) 98.2 F (36.8 C)  SpO2: 97% 97%    Constitutional: Alert and oriented.  Eyes: Conjunctivae are normal.  Head: 1.5 cm well approximated laceration on the right parietal scalp. Nose: No congestion/rhinnorhea. Mouth/Throat: Mucous membranes are moist.   Neck: No stridor. Painless ROM.  Cardiovascular: Normal rate, regular rhythm. Grossly normal heart sounds.  Good peripheral circulation. Respiratory: Normal respiratory effort.  No retractions. Lungs CTAB. Gastrointestinal: Soft and nontender. No distention. No abdominal bruits. No CVA tenderness. Genitourinary:  Musculoskeletal: No lower extremity tenderness nor edema.  No joint effusions. Neurologic:  Normal speech and language. No gross focal neurologic deficits are appreciated. No facial droop Skin:  Skin is warm, dry and intact. No rash noted. Psychiatric: Mood and affect are normal. Speech and behavior are normal.  ____________________________________________   LABS (all labs ordered are listed, but only abnormal results are displayed)  No results found for this or any previous visit (from the past 24 hour(s)). ____________________________________________ ____________________________________________  RADIOLOGY  I personally reviewed all radiographic images ordered to evaluate for the above acute complaints and reviewed radiology reports and findings.  These findings were personally discussed with the patient.  Please see medical record for radiology report.  ____________________________________________   PROCEDURES  Procedure(s) performed:  Marland KitchenMarland KitchenLaceration Repair  Date/Time: 04/23/2019 10:50 AM Performed by: Merlyn Lot, MD Authorized by:  Merlyn Lot, MD   Consent:    Consent obtained:  Verbal   Consent given by:  Patient   Risks discussed:  Infection, pain, retained foreign body, poor cosmetic result and poor wound healing Anesthesia (see MAR for exact dosages):    Anesthesia method:  Local infiltration   Local anesthetic:  Lidocaine 1% w/o epi Laceration details:    Location:  Scalp   Scalp location:  R parietal   Length (cm):  1.5   Depth (mm):  3 Repair type:    Repair type:  Simple Exploration:    Hemostasis achieved with:  Direct pressure   Wound exploration: entire depth of wound probed and visualized     Contaminated: no   Treatment:    Area cleansed with:  Saline   Amount of cleaning:  Extensive   Irrigation solution:  Sterile saline   Visualized foreign bodies/material removed: no   Skin repair:    Repair method:  Staples   Number of staples:  2 Approximation:    Approximation:  Close Post-procedure details:    Dressing:  Sterile dressing   Patient tolerance of procedure:  Tolerated well, no immediate complications      Critical Care performed: no ____________________________________________   INITIAL IMPRESSION / ASSESSMENT AND PLAN / ED COURSE  Pertinent labs & imaging results that were available during my care of the patient were reviewed by me and considered in my medical decision making (see chart for details).   DDX: Laceration, contusion, subdural, epidural, IPH  Julie Potts is a 85 y.o. who presents to the ED with symptoms as described above.  Patient well-appearing in no acute distress.  Laceration repaired as above.  CT imaging ordered for by differential shows no evidence of fracture dislocation or acute intracranial abnormality.  Have discussed with the patient and available family all diagnostics and treatments performed thus far and all questions were answered to the best of my ability. The patient demonstrates understanding and agreement with plan.      The patient  was evaluated in Emergency Department today for the symptoms described in the history of present illness. He/she was evaluated in the context of the global COVID-19 pandemic, which necessitated consideration that the patient might be at risk for infection with the SARS-CoV-2 virus that causes COVID-19. Institutional protocols and algorithms that pertain to the evaluation of patients at risk for COVID-19 are in a state of rapid change based on information released by regulatory bodies including the CDC and federal and state organizations. These policies and algorithms were followed during the patient's care in the ED.  As part of my medical decision making, I reviewed the following data within the [place] notes reviewed and incorporated, Labs reviewed, notes from prior ED visits and [place] Controlled Substance Database   ____________________________________________   FINAL CLINICAL IMPRESSION(S) / ED DIAGNOSES  Final diagnoses:  Laceration of scalp, initial encounter  Fall, initial encounter  Minor head injury, initial encounter      NEW MEDICATIONS STARTED DURING THIS VISIT:  New Prescriptions   No medications on file     Note:  This document was prepared using Dragon voice recognition software and may include unintentional dictation errors.    Merlyn Lot, MD 04/23/19 1051

## 2019-04-23 NOTE — ED Notes (Signed)
Report to Susan, RN  

## 2019-04-23 NOTE — ED Notes (Signed)
Pt alert and oriented X 4, stable for discharge. RR even and unlabored, color WNL. Discussed discharge instructions and follow up when appropriate. Instructed to follow up with ER for any life threatening symptoms or concerns that patient or family of patient may have Left with daughter back to her independent living.

## 2020-04-14 IMAGING — CT CT HEAD W/O CM
4 of 5 series · 16 of 47 positions shown, 18 images · non-contrast
Comparison: 06/09/2017

CLINICAL DATA: Fell in [REDACTED]. Injury to the posterior
head.

EXAM:
CT HEAD WITHOUT CONTRAST
CT CERVICAL SPINE WITHOUT CONTRAST
TECHNIQUE: Multidetector CT imaging of the head and cervical spine was
performed following the standard protocol without intravenous
contrast. Multiplanar CT image reconstructions of the cervical spine
were also generated.

[Series 2: head wo · axial · 0.47mm/px · z∈[-233,-123]mm · 7 of 30 slices shown, 9 images]
[im 4/30  brain]
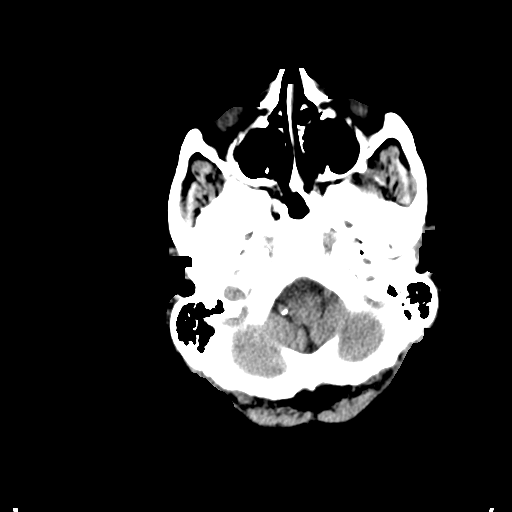
[im 4/30  bone]
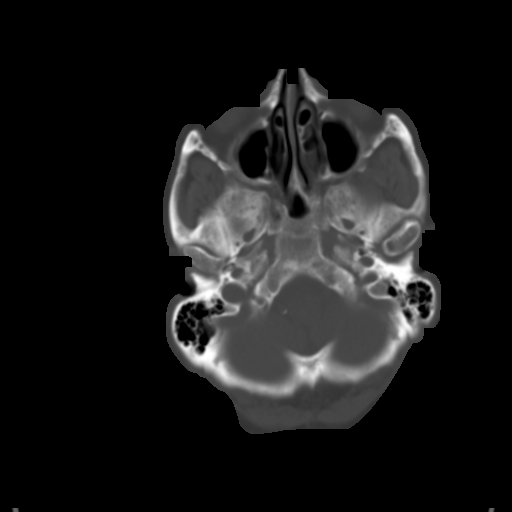
[im 8/30  brain]
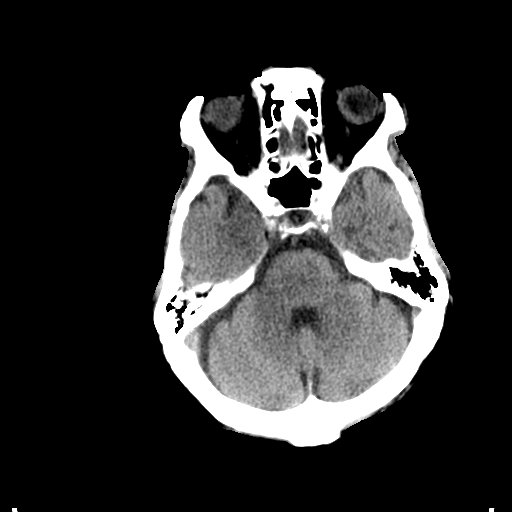
[im 11/30  brain]
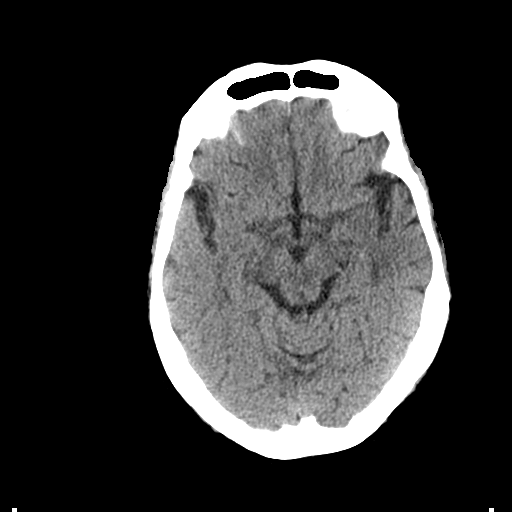
[im 15/30  brain]
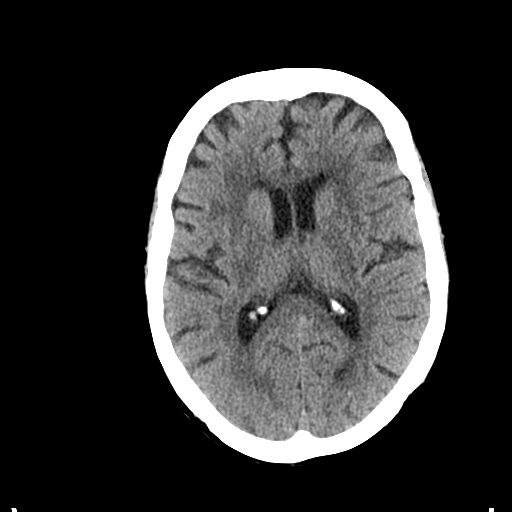
[im 19/30  brain]
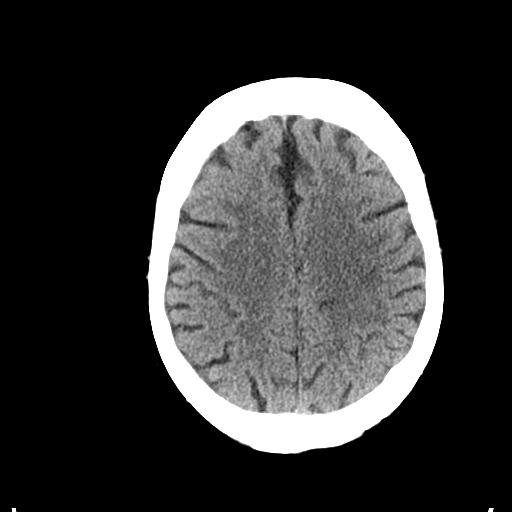
[im 19/30  bone]
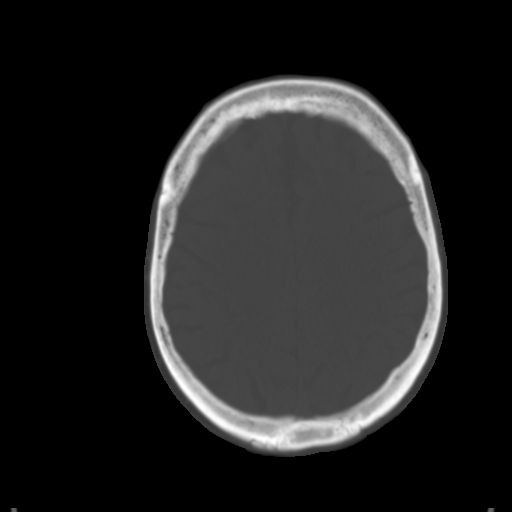
[im 22/30  brain]
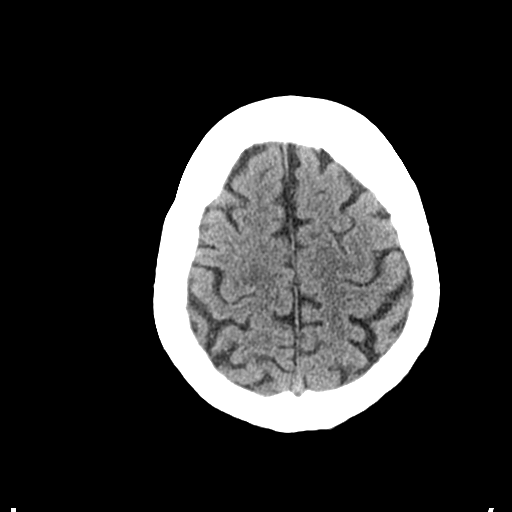
[im 26/30  brain]
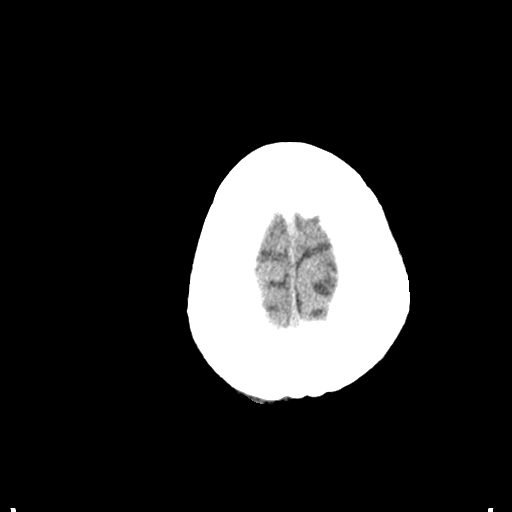

[Series 4: coronal soft tissue · coronal · 0.29mm/px · 3 of 67 slices shown]
[im 23/67  brain]
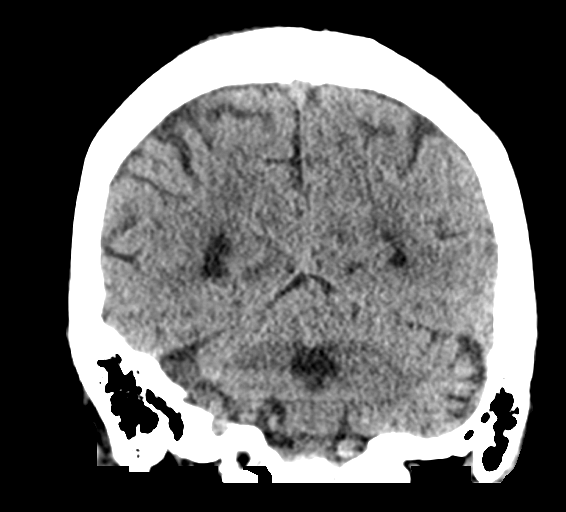
[im 30/67  brain]
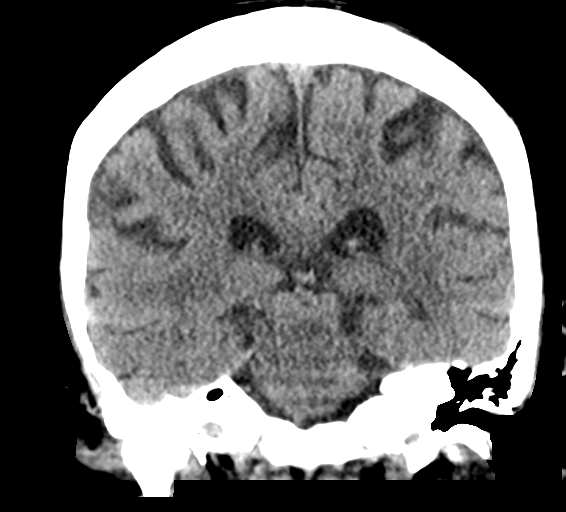
[im 37/67  brain]
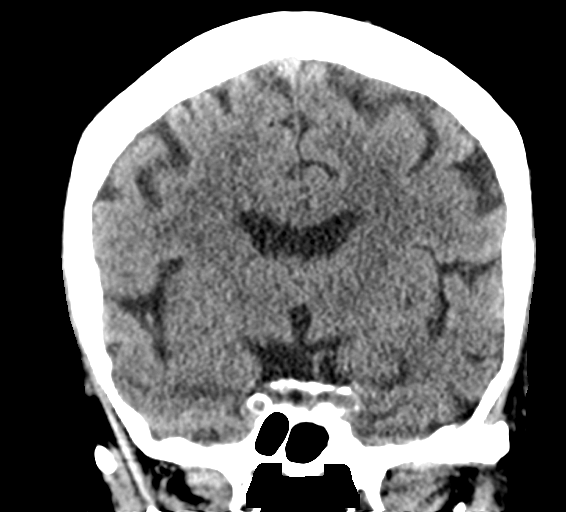

[Series 5: sagittal soft tissue · sagittal · 0.29mm/px · 2 of 56 slices shown]
[im 19/56  brain]
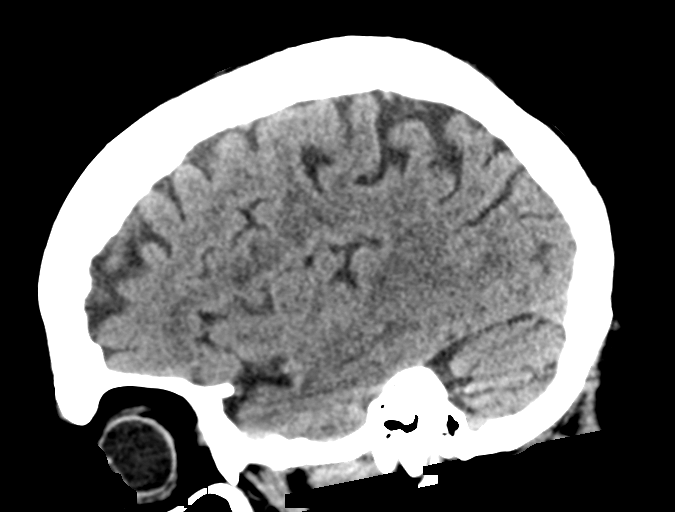
[im 37/56  brain]
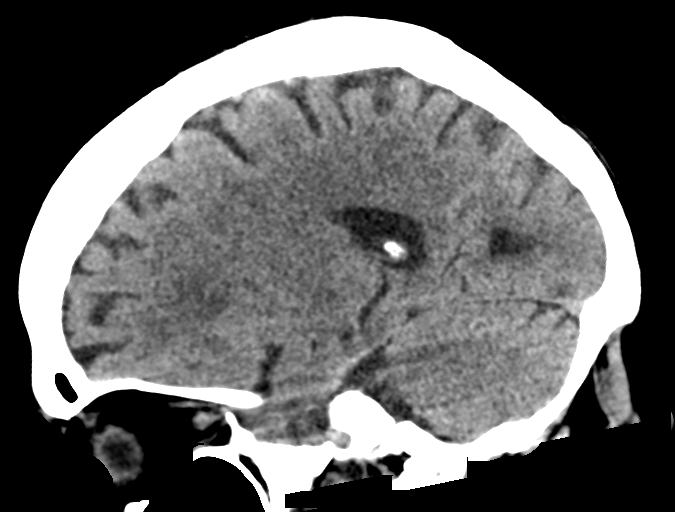

[Series 7: c spine soft (person_name) · axial · 0.38mm/px · z∈[-360,-312]mm · 4 of 71 slices shown]
[im 8/71  brain]
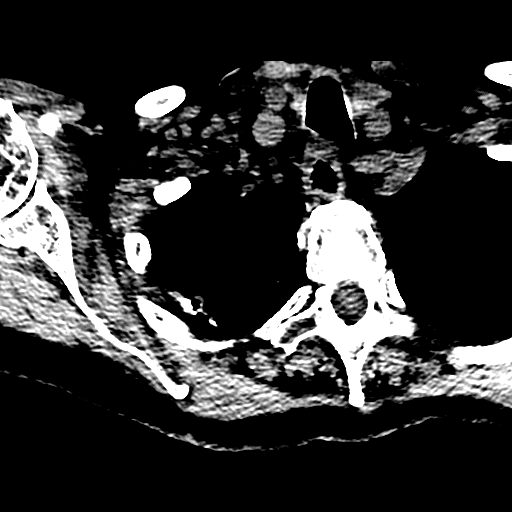
[im 15/71  brain]
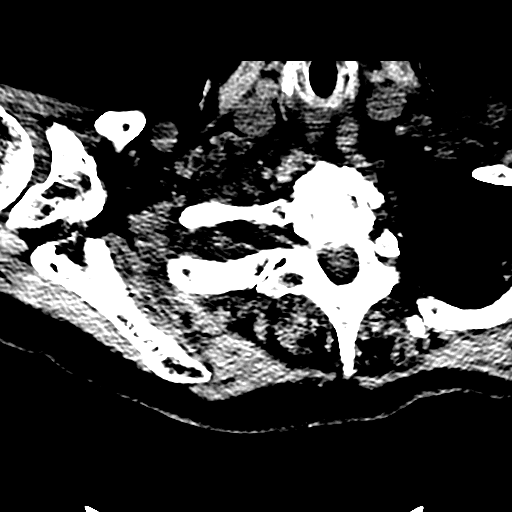
[im 22/71  brain]
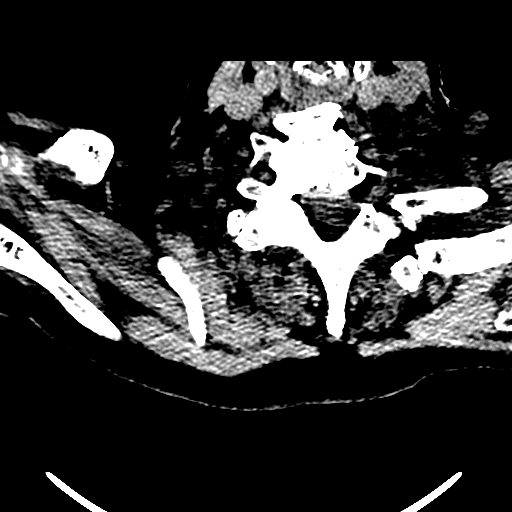
[im 32/71  brain]
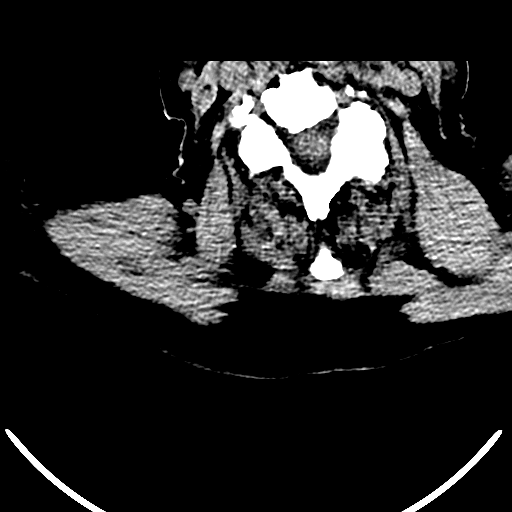

[16 of 47 positions shown; findings below may reference images not displayed]

FINDINGS: CT HEAD FINDINGS

Brain: Mild age related atrophy. Moderate chronic small-vessel
ischemic changes of the cerebral hemispheric white matter. No
cortical or large vessel territory infarction. No mass lesion,
hemorrhage, hydrocephalus or extra-axial collection.

Vascular: There is atherosclerotic calcification of the major
vessels at the base of the brain.

Skull: No skull fracture.

Sinuses/Orbits: Clear/normal

Other: None

CT CERVICAL SPINE FINDINGS

Alignment: No traumatic malalignment. Chronic curvature convex to
the right. Chronic degenerative anterolisthesis of 3 mm at C5-6.

Skull base and vertebrae: No fracture or traumatic finding.

Soft tissues and spinal canal: Negative

Disc levels: Advanced degenerative change at the C1-2 articulation.
Chronic spondylosis C3-4 through C6-7 with disc space narrowing and
endplate osteophytes. Facet osteoarthritis on the right at C3-4
through C7-T1 and on the left at C2-3, C4-5 and C5-6. Chronic fusion
of the facets on the left at C3-4.

Upper chest: Pleural and parenchymal scarring with some
calcification on the right. No significant finding.

Other: None
IMPRESSION: Head CT: No acute or traumatic finding. Ordinary chronic
small-vessel ischemic changes of the cerebral hemispheric white
matter.

Cervical spine CT: No acute or traumatic finding. Chronic curvature
and degenerative changes as outlined above

## 2020-06-22 DIAGNOSIS — H353211 Exudative age-related macular degeneration, right eye, with active choroidal neovascularization: Secondary | ICD-10-CM | POA: Insufficient documentation

## 2021-02-15 DIAGNOSIS — H43813 Vitreous degeneration, bilateral: Secondary | ICD-10-CM | POA: Insufficient documentation

## 2021-11-13 DIAGNOSIS — H1031 Unspecified acute conjunctivitis, right eye: Secondary | ICD-10-CM | POA: Insufficient documentation

## 2022-10-05 ENCOUNTER — Encounter: Payer: Medicare PPO | Attending: Physician Assistant | Admitting: Physician Assistant

## 2022-10-05 DIAGNOSIS — I129 Hypertensive chronic kidney disease with stage 1 through stage 4 chronic kidney disease, or unspecified chronic kidney disease: Secondary | ICD-10-CM | POA: Diagnosis not present

## 2022-10-05 DIAGNOSIS — N183 Chronic kidney disease, stage 3 unspecified: Secondary | ICD-10-CM | POA: Insufficient documentation

## 2022-10-05 DIAGNOSIS — I70238 Atherosclerosis of native arteries of right leg with ulceration of other part of lower right leg: Secondary | ICD-10-CM | POA: Insufficient documentation

## 2022-10-05 DIAGNOSIS — L97812 Non-pressure chronic ulcer of other part of right lower leg with fat layer exposed: Secondary | ICD-10-CM | POA: Insufficient documentation

## 2022-10-08 NOTE — Progress Notes (Addendum)
AKITA, KRUMWIEDE ([id number]) [id number] Page 1 of 8 Visit Report for 10/05/2022 Allergy List Details Patient Name: Date of Service: Julie Agent, DO RIS 10/05/2022 8:30 A M Medical Record Number: [id number] Patient Account Number: [id number] Date of Birth/Sex: Treating RN: [birth year] (89 y.o. Julie Potts Julie Potts: Julie Potts Other Clinician: Referring Kassidee Narciso: Treating Ayodeji Keimig/Extender: Kathi Ludwig Weeks in Treatment: 0 Allergies Active Allergies ciprofloxacin Severity: Moderate levofloxacin Severity: Moderate Allergy Notes Electronic Signature(s) Signed: 10/05/2022 1:32:34 PM By: Julie Loud MSN RN CNS WTA Entered By: Julie Potts on 10/05/2022 13:32:34 -------------------------------------------------------------------------------- Arrival Information Details Patient Name: Date of Service: Julie Agent, DO RIS 10/05/2022 8:30 A M Medical Record Number: [id number] Patient Account Number: [id number] Date of Birth/Sex: Treating RN: 1922/09/26 (89 y.o. Julie Potts Elaura Calix: Julie Potts Other Clinician: Referring Peyson Delao: Treating Yousif Edelson/Extender: Kathi Ludwig Weeks in Treatment: 0 Visit Information Patient Arrived: Julie Potts Arrival Time: 08:39 Accompanied By: Julie Potts Transfer Assistance: None Patient Identification Verified: Yes Secondary Verification Process Completed: Yes Patient Requires Transmission-Based Precautions: No Patient Has Alerts: Yes Patient Alerts: Not Diabetic 10/05/22 R L ABI PAD [place] Electronic Signature(s) Signed: 10/05/2022 1:32:09 PM By: Julie Loud MSN RN CNS WTA Entered By: Julie Potts on 10/05/2022 13:32:09 -------------------------------------------------------------------------------- Clinic Level of Potts Assessment Details Patient Name: Date of Service: [name], DO RIS 10/05/2022 8:30 A M Medical Record Number: [id number] Patient Account Number: [id number] Date  of Birth/Sex: Treating RN: 03/01/1923 (89 y.o. Julie Potts Hermen Mario: Julie Potts Other Clinician: Referring Arlys Scatena: Treating Claudene Gatliff/Extender: Kathi Ludwig Weeks in Treatment: 0 Clinic Level of Potts Assessment Items TOOL 2 Quantity Score Hainline, Theola ([id number]) [id number] Page 2 of 8 []  - 0 Use when only an EandM is performed on the INITIAL visit ASSESSMENTS - Nursing Assessment / Reassessment X- 1 20 General Physical Exam (combine w/ comprehensive assessment (listed just below) when performed on new pt. evals) X- 1 25 Comprehensive Assessment (HX, ROS, Risk Assessments, Wounds Hx, etc.) ASSESSMENTS - Wound and Skin A ssessment / Reassessment X - Simple Wound Assessment / Reassessment - one wound 1 5 []  - 0 Complex Wound Assessment / Reassessment - multiple wounds []  - 0 Dermatologic / Skin Assessment (not related to wound area) ASSESSMENTS - Ostomy and/or Continence Assessment and Potts []  - 0 Incontinence Assessment and Management []  - 0 Ostomy Potts Assessment and Management (repouching, etc.) PROCESS - Coordination of Potts X - Simple Patient / Family Education for ongoing Potts 1 15 []  - 0 Complex (extensive) Patient / Family Education for ongoing Potts X- 1 10 Staff obtains Programmer, systems, Records, T Results / Process Orders est []  - 0 Staff telephones HHA, Nursing Homes / Clarify orders / etc []  - 0 Routine Transfer to another Facility (non-emergent condition) []  - 0 Routine Hospital Admission (non-emergent condition) []  - 0 New Admissions / Biomedical engineer / Ordering NPWT Apligraf, etc. , X- 1 20 Emergency Hospital Admission (emergent condition) []  - 0 Simple Discharge Coordination []  - 0 Complex (extensive) Discharge Coordination PROCESS - Special Needs []  - 0 Pediatric / Minor Patient Management []  - 0 Isolation Patient Management []  - 0 Hearing / Language / Visual special needs []  -  0 Assessment of Community assistance (transportation, D/C planning, etc.) []  - 0 Additional assistance / Altered mentation []  - 0 Support Surface(s) Assessment (bed, cushion, seat, etc.) INTERVENTIONS - Wound Cleansing / Measurement X- 1 5 Wound Imaging (photographs - any number of wounds) []  - 0 Wound Tracing (instead  of photographs) X- 1 5 Simple Wound Measurement - one wound []  - 0 Complex Wound Measurement - multiple wounds X- 1 5 Simple Wound Cleansing - one wound []  - 0 Complex Wound Cleansing - multiple wounds INTERVENTIONS - Wound Dressings []  - 0 Small Wound Dressing one or multiple wounds X- 1 15 Medium Wound Dressing one or multiple wounds []  - 0 Large Wound Dressing one or multiple wounds []  - 0 Application of Medications - injection INTERVENTIONS - Miscellaneous []  - 0 External ear exam []  - 0 Specimen Collection (cultures, biopsies, blood, body fluids, etc.) []  - 0 Specimen(s) / Culture(s) sent or taken to Lab for analysis Banks, Angline ([id number]) [id number] Page 3 of 8 []  - 0 Patient Transfer (multiple staff / Civil Service fast streamer / Similar devices) []  - 0 Simple Staple / Suture removal (25 or less) []  - 0 Complex Staple / Suture removal (26 or more) []  - 0 Hypo / Hyperglycemic Management (close monitor of Blood Glucose) X- 1 15 Ankle / Brachial Index (ABI) - do not check if billed separately Has the patient been seen at the hospital within the last three years: Yes Total Score: 140 Level Of Potts: New/Established - Level 4 Electronic Signature(s) Signed: 10/30/2022 11:30:11 AM By: Julie Loud MSN RN CNS WTA Previous Signature: 10/10/2022 9:49:41 PM Version By: Gretta Cool, BSN, RN, CWS, Kim RN, BSN Previous Signature: 10/05/2022 2:53:23 PM Version By: Julie Loud MSN RN CNS WTA Entered By: Julie Potts on 10/12/2022 10:52:24 -------------------------------------------------------------------------------- Encounter Discharge Information  Details Patient Name: Date of Service: Julie Agent, DO RIS 10/05/2022 8:30 A M Medical Record Number: [id number] Patient Account Number: [id number] Date of Birth/Sex: Treating RN: [birth year] (89 y.o. Julie Potts Anishka Bushard: Julie Potts Other Clinician: Referring Jaliya Siegmann: Treating Alura Olveda/Extender: Kathi Ludwig Weeks in Treatment: 0 Encounter Discharge Information Items Post Procedure Vitals Discharge Condition: Stable Temperature (F): 97.6 Ambulatory Status: Walker Pulse (bpm): 61 Discharge Destination: Home Respiratory Rate (breaths/min): 16 Transportation: Private Auto Blood Pressure (mmHg): 152/53 Accompanied By: daughter Schedule Follow-up Appointment: Yes Clinical Summary of Potts: Electronic Signature(s) Signed: 10/30/2022 11:30:11 AM By: Julie Loud MSN RN CNS WTA Previous Signature: 10/05/2022 1:34:43 PM Version By: Julie Loud MSN RN CNS WTA Entered By: Julie Potts on 10/12/2022 10:52:46 -------------------------------------------------------------------------------- Lower Extremity Assessment Details Patient Name: Date of Service: Julie Agent, DO RIS 10/05/2022 8:30 A M Medical Record Number: [id number] Patient Account Number: [id number] Date of Birth/Sex: Treating RN: 1923/03/15 (89 y.o. Julie Potts Charvez Voorhies: Julie Potts Other Clinician: Referring Akari Crysler: Treating Mannie Wineland/Extender: Kathi Ludwig Weeks in Treatment: 0 Edema Assessment Assessed: [Left: No] [Right: No] [Left: Edema] [Right: :] Calf Left: Right: Point of Measurement: 32 cm From Medial Instep 35.4 cm Ankle Left: Right: Point of Measurement: 12 cm From Medial Instep 25.8 cm Knee To Floor Left: Right: From Medial Instep 40 cm Fontanilla, Jamison ([id number]) [id number] Page 4 of 8 Vascular Assessment Pulses: Dorsalis Pedis Palpable: [Right:Yes] Doppler Audible: [Right:Yes] Posterior Tibial Palpable:  [Right:Yes] Doppler Audible: [Right:Yes] Popliteal Palpable: [Right:Yes Yes] Electronic Signature(s) Signed: 10/05/2022 1:32:31 PM By: Julie Loud MSN RN CNS WTA Entered By: Julie Potts on 10/05/2022 13:32:31 -------------------------------------------------------------------------------- Multi Wound Chart Details Patient Name: Date of Service: Julie Agent, DO RIS 10/05/2022 8:30 A M Medical Record Number: [id number] Patient Account Number: [id number] Date of Birth/Sex: Treating RN: [birth year] (89 y.o. Julie Potts Shiasia Porro: Julie Potts Other Clinician: Referring Averi Cacioppo: Treating Claus Silvestro/Extender: Kathi Ludwig Weeks in Treatment: 0 Vital Signs Height(in):  63 Pulse(bpm): 61 Weight(lbs): 145 Blood Pressure(mmHg): 152/53 Body Mass Index(BMI): 25.7 Temperature(F): 97.6 Respiratory Rate(breaths/min): 16 [1:Photos:] [N/A:N/A] Right, Anterior Lower Leg N/A N/A Wound Location: Gradually Appeared N/A N/A Wounding Event: Arterial Insufficiency Ulcer N/A N/A Primary Etiology: Asthma, Hypertension N/A N/A Comorbid History: 09/03/2022 N/A N/A Date Acquired: 0 N/A N/A Weeks of Treatment: Open N/A N/A Wound Status: No N/A N/A Wound Recurrence: 1x1x0.1 N/A N/A Measurements L x W x D (cm) 0.785 N/A N/A A (cm) : rea 0.079 N/A N/A Volume (cm) : Full Thickness Without Exposed N/A N/A Classification: Support Structures Medium N/A N/A Exudate Amount: Serosanguineous N/A N/A Exudate Type: red, brown N/A N/A Exudate Color: Medium (34-66%) N/A N/A Granulation Amount: Red, Pink N/A N/A Granulation Quality: None Present (0%) N/A N/A Necrotic Amount: Fascia: No N/A N/A Exposed Structures: Fat Layer (Subcutaneous Tissue): No Tendon: No Muscle: No Joint: No Bone: No Small (1-33%) N/A N/A Epithelialization: Chemical/Enzymatic/Mechanical N/A N/A Debridement: Other(gauze) N/A N/A Instrument: Moderate N/A N/A Bleeding: Pressure N/A  N/A Hemostasis A chievedNickelle Barman, Tamela Oddi ([id number]) [id number] Page 5 of 8 Procedure was tolerated well N/A N/A Debridement Treatment Response: 1x1x0.2 N/A N/A Post Debridement Measurements L x W x D (cm) 0.157 N/A N/A Post Debridement Volume: (cm) Debridement N/A N/A Procedures Performed: Treatment Notes Wound #1 (Lower Leg) Wound Laterality: Right, Anterior Cleanser Soap and Water Discharge Instruction: Gently cleanse wound with antibacterial soap, rinse and pat dry prior to dressing wounds Peri-Wound Potts Topical Primary Dressing Hydrofera Blue Ready Transfer Foam, 2.5x2.5 (in/in) Discharge Instruction: Apply Hydrofera Blue Ready to wound bed as directed Secondary Dressing [place] Discharge Instruction: [place] as directed Secured With [place] H Soft Cloth Surgical T ape ape, 2x2 (in/yd) Compression Wrap Compression Stockings Environmental education officer) Signed: 10/05/2022 1:34:12 PM By: Julie Loud MSN RN CNS WTA Previous Signature: 10/05/2022 1:33:13 PM Version By: Julie Loud MSN RN CNS WTA Entered By: Julie Potts on 10/05/2022 13:34:12 -------------------------------------------------------------------------------- Multi-Disciplinary Potts Plan Details Patient Name: Date of Service: Julie Agent, DO RIS 10/05/2022 8:30 A M Medical Record Number: [id number] Patient Account Number: [id number] Date of Birth/Sex: Treating RN: [birth year] (89 y.o. Julie Potts Angelina Neece: Julie Potts Other Clinician: Referring Amiya Escamilla: Treating Ifrah Vest/Extender: Kathi Ludwig Weeks in Treatment: 0 Active Inactive Nutrition Nursing Diagnoses: Potential for alteratiion in Nutrition/Potential for imbalanced nutrition Goals: Patient/caregiver agrees to and verbalizes understanding of need to obtain nutritional consultation Date Initiated: 10/05/2022 Target  Resolution Date: 11/05/2022 Goal Status: Active Patient/caregiver agrees to and verbalizes understanding of need to use nutritional supplements and/or vitamins as prescribed Date Initiated: 10/05/2022 Target Resolution Date: 11/05/2022 Goal Status: Active Patient/caregiver verbalizes understanding of need to maintain therapeutic glucose control per primary Potts physician Date Initiated: 10/05/2022 Target Resolution Date: 11/05/2022 Goal Status: Active Interventions: Assess patient nutrition upon admission and as needed per policy Provide education on nutrition [place], Tamela Oddi ([id number]) [id number] Page 6 of 8 Treatment Activities: Dietary management education, guidance and counseling : 10/05/2022 Notes: Orientation to the Wound Potts Program Nursing Diagnoses: Knowledge deficit related to the wound healing center program Goals: Patient/caregiver will verbalize understanding of the [place] Date Initiated: 10/05/2022 Target Resolution Date: 11/05/2022 Goal Status: Active Interventions: Provide education on orientation to the wound center Notes: Wound/Skin Impairment Nursing Diagnoses: Impaired tissue integrity Knowledge deficit related to ulceration/compromised skin integrity Goals: Patient will have a decrease in wound volume by X% from date: (specify in notes) Date Initiated: 10/05/2022 Target Resolution Date:  11/05/2022 Goal Status: Active Patient/caregiver will verbalize understanding of skin Potts regimen Date Initiated: 10/05/2022 Target Resolution Date: 11/05/2022 Goal Status: Active Ulcer/skin breakdown will have a volume reduction of 30% by week 4 Date Initiated: 10/05/2022 Target Resolution Date: 11/05/2022 Goal Status: Active Ulcer/skin breakdown will have a volume reduction of 50% by week 8 Date Initiated: 10/05/2022 Target Resolution Date: 12/05/2022 Goal Status: Active Interventions: Assess patient/caregiver ability to obtain necessary  supplies Assess ulceration(s) every visit Provide education on ulcer and skin Potts Treatment Activities: Skin Potts regimen initiated : 10/05/2022 Notes: Electronic Signature(s) Signed: 10/05/2022 1:33:50 PM By: Julie Loud MSN RN CNS WTA Entered By: Julie Potts on 10/05/2022 13:33:50 -------------------------------------------------------------------------------- Pain Assessment Details Patient Name: Date of Service: Julie Agent, DO RIS 10/05/2022 8:30 A M Medical Record Number: [id number] Patient Account Number: [id number] Date of Birth/Sex: Treating RN: 1923/02/17 (89 y.o. Julie Potts Susette Seminara: Julie Potts Other Clinician: Referring Raia Amico: Treating Erilyn Pearman/Extender: Kathi Ludwig Weeks in Treatment: 0 Active Problems Location of Pain Severity and Description of Pain Patient Has Paino No Site Locations [place], [place] ([id number]) [id number] Page 7 of 8 Pain Management and Medication Current Pain Management: Electronic Signature(s) Signed: 10/05/2022 1:32:14 PM By: Julie Loud MSN RN CNS WTA Entered By: Julie Potts on 10/05/2022 13:32:14 -------------------------------------------------------------------------------- Patient/Caregiver Education Details Patient Name: Date of Service: Julie Agent, DO RIS 3/1/2024andnbsp8:30 A M Medical Record Number: [id number] Patient Account Number: [id number] Date of Birth/Gender: Treating RN: May 23, 1923 (89 y.o. Julie Potts Physician: Julie Potts Other Clinician: Referring Physician: Treating Physician/Extender: Kathi Ludwig Weeks in Treatment: 0 Education Assessment Education Provided To: Patient Education Topics Provided Welcome T [facility]-New Patient Packet: o Handouts: Welcome T [facility] [place] o Methods: Explain/Verbal Responses: State content correctly Wound/Skin Impairment: Handouts: Caring for Your Ulcer Methods:  Explain/Verbal Responses: State content correctly Electronic Signature(s) Signed: 10/05/2022 2:53:23 PM By: Julie Loud MSN RN CNS WTA Entered By: Julie Potts on 10/05/2022 13:33:56 -------------------------------------------------------------------------------- Wound Assessment Details Patient Name: Date of Service: Julie Agent, DO RIS 10/05/2022 8:30 A M Medical Record Number: [id number] Patient Account Number: [id number] Date of Birth/Sex: Treating RN: [birth year] (89 y.o. Julie Potts [place] Nellums: Julie Potts Other Clinician: Referring Amiaya Mcneeley: Treating Shareeka Yim/Extender: Kathi Ludwig Weeks in Treatment: 0 Wound Status [place], Tamela Oddi ([id number]) [id number] Page 8 of 8 Wound Number: 1 Primary Etiology: Arterial Insufficiency Ulcer Wound Location: Right, Anterior Lower Leg Wound Status: Open Wounding Event: Gradually Appeared Comorbid History: Asthma, Hypertension Date Acquired: 09/03/2022 Weeks Of Treatment: 0 Clustered Wound: No Photos Wound Measurements Length: (cm) 1 Width: (cm) 1 Depth: (cm) 0.1 Area: (cm) 0.785 Volume: (cm) 0.079 % Reduction in Area: % Reduction in Volume: Epithelialization: Small (1-33%) Tunneling: No Undermining: No Wound Description Classification: Full Thickness Without Exposed Support Structures Exudate Amount: Medium Exudate Type: Serosanguineous Exudate Color: red, brown Foul Odor After Cleansing: No Slough/Fibrino No Wound Bed Granulation Amount: Medium (34-66%) Exposed Structure Granulation Quality: Red, Pink Fascia Exposed: No Necrotic Amount: None Present (0%) Fat Layer (Subcutaneous Tissue) Exposed: No Tendon Exposed: No Muscle Exposed: No Joint Exposed: No Bone Exposed: No Electronic Signature(s) Signed: 10/05/2022 2:53:23 PM By: Julie Loud MSN RN CNS WTA Entered By: Julie Potts on 10/05/2022  09:26:28 -------------------------------------------------------------------------------- Vitals Details Patient Name: Date of Service: Julie Agent, DO RIS 10/05/2022 8:30 A M Medical Record Number: [id number] Patient Account Number: [id number] Date of Birth/Sex: Treating RN: April 10, 1923 (89 y.o. Julie Potts Chrystle Murillo: Julie Potts Other  Clinician: Referring Kimisha Eunice: Treating Addis Bennie/Extender: Jesse Fall in Treatment: 0 Vital Signs Time Taken: 08:40 Temperature (F): 97.6 Height (in): 63 Pulse (bpm): 61 Source: Stated Respiratory Rate (breaths/min): 16 Weight (lbs): 145 Blood Pressure (mmHg): 152/53 Source: Stated Reference Range: 80 - 120 mg / dl Body Mass Index (BMI): 25.7 Electronic Signature(s) Signed: 10/05/2022 1:32:17 PM By: Julie Loud MSN RN CNS WTA Entered By: Julie Potts on 10/05/2022 13:32:17

## 2022-10-08 NOTE — Progress Notes (Addendum)
[name], [name] ([id number]) [id number] Page 1 of 8 Visit Report for 10/05/2022 Chief Complaint Document Details Patient Name: Date of Service: Julie Agent, DO RIS 10/05/2022 8:30 A M Medical Record Number: [id number] Patient Account Number: [id number] Date of Birth/Sex: Treating RN: [birth year] (89 y.o. Julie Potts Primary Care Provider: Dereck Ligas Other Clinician: Referring Provider: Treating Provider/Extender: Kathi Ludwig Weeks in Treatment: 0 Information Obtained from: Patient Chief Complaint Right LE Ulcer Electronic Signature(s) Signed: 10/05/2022 9:20:45 AM By: Worthy Keeler PA-C Entered By: Worthy Keeler on 10/05/2022 09:20:45 -------------------------------------------------------------------------------- Debridement Details Patient Name: Date of Service: [name], DO RIS 10/05/2022 8:30 A M Medical Record Number: [id number] Patient Account Number: [id number] Date of Birth/Sex: Treating RN: [birth year] (89 y.o. Julie Potts Primary Care Provider: Dereck Ligas Other Clinician: Referring Provider: Treating Provider/Extender: Kathi Ludwig Weeks in Treatment: 0 Debridement Performed for Assessment: Wound #1 Right,Anterior Lower Leg Performed By: Physician Tommie Sams., PA-C Debridement Type: Chemical/Enzymatic/Mechanical Potts Used: gauze Severity of Tissue Pre Debridement: Fat layer exposed Level of Consciousness (Pre-procedure): Awake and Alert Pre-procedure Verification/Time Out No Taken: Instrument: Other : gauze Bleeding: Moderate Hemostasis Achieved: Pressure Response to Treatment: Procedure was tolerated well Level of Consciousness (Post- Awake and Alert procedure): Post Debridement Measurements of Total Wound Length: (cm) 1 Width: (cm) 1 Depth: (cm) 0.2 Volume: (cm) 0.157 Character of Wound/Ulcer Post Debridement: Stable Severity of Tissue Post Debridement: Fat layer exposed Post Procedure  Diagnosis Same as Pre-procedure Electronic Signature(s) Signed: 10/05/2022 1:46:09 PM By: Worthy Keeler PA-C Signed: 10/05/2022 2:53:23 PM By: Rosalio Loud MSN RN CNS WTA Entered By: Rosalio Loud on 10/05/2022 09:27:54 -------------------------------------------------------------------------------- HPI Details Patient Name: Date of Service: Julie Agent, DO RIS 10/05/2022 8:30 A M Medical Record Number: [id number] Patient Account Number: [id number] Julie Potts ([id number]) [id number] Page 2 of 8 Date of Birth/Sex: Treating RN: September 22, 1922 (89 y.o. Julie Potts Primary Care Provider: Other Clinician: Dereck Ligas Referring Provider: Treating Provider/Extender: Kathi Ludwig Weeks in Treatment: 0 History of Present Illness HPI Description: 10-05-2022 patient presents today for initial evaluation here at clinic concerning a wound over the right anterior lower leg that has been present since September 03, 2022. The patient tells me that she fell when I ask her how exactly she fell she tells me she does not really know "I just fell". She is 89 years old and really does not want to do anything too aggressive as far as medical intervention is concerned but she does want to obviously get this wound healed. She has been on Doxy for 10 days that is complete at this point she is been using mupirocin topically up to this point. Fortunately I do not see any signs of active infection locally nor systemically which is great news. Patient does have a history of peripheral vascular disease, hypertension, chronic kidney disease stage III. Electronic Signature(s) Signed: 10/05/2022 1:43:37 PM By: Worthy Keeler PA-C Entered By: Worthy Keeler on 10/05/2022 13:43:36 -------------------------------------------------------------------------------- Physical Exam Details Patient Name: Date of Service: [name], DO RIS 10/05/2022 8:30 A M Medical Record Number:  [id number] Patient Account Number: [id number] Date of Birth/Sex: Treating RN: 23-Feb-1923 (89 y.o. Julie Potts Primary Care Provider: Dereck Ligas Other Clinician: Referring Provider: Treating Provider/Extender: Kathi Ludwig Weeks in Treatment: 0 Constitutional patient is hypertensive.. pulse regular and within target range for patient.Marland Kitchen respirations regular, non-labored and within target range for patient.Marland Kitchen temperature within target range for patient.. Well-nourished and well-hydrated in no acute  distress. Eyes conjunctiva clear no eyelid edema noted. pupils equal round and reactive to light and accommodation. Ears, Nose, Mouth, and Throat no gross abnormality of ear auricles or external auditory canals. normal hearing noted during conversation. mucus membranes moist. Respiratory normal breathing without difficulty. Cardiovascular Absent posterior tibial and dorsalis pedis pulses bilateral lower extremities. 2+ pitting edema of the bilateral lower extremities. Musculoskeletal normal gait and posture. no significant deformity or arthritic changes, no loss or range of motion, no clubbing. Psychiatric this patient is able to make decisions and demonstrates good insight into disease process. Alert and Oriented x 3. pleasant and cooperative. Notes Upon inspection patient's wound bed actually showed signs of good granulation epithelization at this point. Fortunately there does not appear to be any signs of active infection locally nor systemically which is great news. With that being said the wound that she has does appear to be very small she actually seems to have pretty good blood flow I was able to carefully clean this away with saline and gauze today there is no need for sharp debridement and to be honest I think that this will likely do well if we get her edema under control. I would recommend Tubigrip as it will be compatible even if her blood flow is not optimal which  it appears it may not be it was showing peripheral arterial disease and noncompressibility on the testing today in the clinic as a screening. And she did have quite a bit of pitting edema noted. Electronic Signature(s) Signed: 10/05/2022 1:44:30 PM By: Worthy Keeler PA-C Entered By: Worthy Keeler on 10/05/2022 13:44:30 -------------------------------------------------------------------------------- Physician Orders Details Patient Name: Date of Service: [name], DO RIS 10/05/2022 8:30 A M Medical Record Number: [id number] Patient Account Number: [id number] Date of Birth/Sex: Treating RN: April 22, 1923 (89 y.o. Julie Potts Primary Care Provider: Dereck Ligas Other Clinician: Demetrios Potts ([id number]) [id number] Page 3 of 8 Referring Provider: Treating Provider/Extender: Kathi Ludwig Weeks in Treatment: 0 Verbal / Phone Orders: No Diagnosis Coding ICD-10 Coding Code Description I73.89 Other specified peripheral vascular diseases L97.812 Non-pressure chronic ulcer of other part of right lower leg with fat layer exposed I10 Essential (primary) hypertension N18.30 Chronic kidney disease, stage 3 unspecified Follow-up Appointments Return Appointment in 1 week. Bathing/ Shower/ Hygiene May shower; gently cleanse wound with antibacterial soap, rinse and pat dry prior to dressing wounds Edema Control - Lymphedema / Segmental Compressive Device / Other Tubigrip single layer applied. - D Wound Treatment Wound #1 - Lower Leg Wound Laterality: Right, Anterior Cleanser: Byram Ancillary Kit - 15 Day Supply (DME) (Generic) 3 x Per Week/30 Days Discharge Instructions: Use supplies as instructed; Kit contains: (15) Saline Bullets; (15) 3x3 Gauze; 15 pr Gloves Cleanser: Soap and Water 3 x Per Week/30 Days Discharge Instructions: Gently cleanse wound with antibacterial soap, rinse and pat dry prior to dressing wounds Prim Dressing: Hydrofera Blue  Ready Transfer Foam, 2.5x2.5 (in/in) (DME) (Dispense As Written) 3 x Per Week/30 Days ary Discharge Instructions: Apply Hydrofera Blue Ready to wound bed as directed Secondary Dressing: ABD Pad 5x9 (in/in) (DME) (Generic) 3 x Per Week/30 Days Discharge Instructions: Cover with ABD pad Secondary Dressing: Conforming Guaze Roll-Large 3 x Per Week/30 Days Discharge Instructions: Apply Conforming Stretch Guaze Bandage as directed Secured With: Medipore T - 62M Medipore H Soft Cloth Surgical T ape ape, 2x2 (in/yd) (DME) (Generic) 3 x Per Week/30 Days Electronic Signature(s) Signed: 10/24/2022 10:41:30 AM By: Worthy Keeler PA-C Signed: 10/30/2022 11:30:11 AM  By: Rosalio Loud MSN RN CNS WTA Previous Signature: 10/05/2022 1:46:09 PM Version By: Worthy Keeler PA-C Previous Signature: 10/05/2022 2:53:23 PM Version By: Rosalio Loud MSN RN CNS WTA Entered By: Rosalio Loud on 10/12/2022 10:52:16 -------------------------------------------------------------------------------- Problem List Details Patient Name: Date of Service: Julie Agent, DO RIS 10/05/2022 8:30 A M Medical Record Number: [id number] Patient Account Number: [id number] Date of Birth/Sex: Treating RN: [birth year] (89 y.o. Julie Potts Primary Care Provider: Dereck Ligas Other Clinician: Referring Provider: Treating Provider/Extender: Kathi Ludwig Weeks in Treatment: 0 Active Problems ICD-10 Encounter Code Description Active Date MDM Diagnosis I73.89 Other specified peripheral vascular diseases 10/05/2022 No Yes L97.812 Non-pressure chronic ulcer of other part of right lower leg with fat layer 10/05/2022 No Yes exposed [place], [place] ([id number]) [phone].pdf Page 4 of 8 I10 Essential (primary) hypertension 10/05/2022 No Yes N18.30 Chronic kidney disease, stage 3 unspecified 10/05/2022 No Yes Inactive Problems Resolved Problems Electronic Signature(s) Signed: 10/05/2022 1:34:06 PM By: Rosalio Loud MSN  RN CNS WTA Signed: 10/05/2022 1:46:09 PM By: Worthy Keeler PA-C Previous Signature: 10/05/2022 9:20:10 AM Version By: Worthy Keeler PA-C Previous Signature: 10/05/2022 9:00:51 AM Version By: Worthy Keeler PA-C Entered By: Rosalio Loud on 10/05/2022 13:34:06 -------------------------------------------------------------------------------- Progress Note Details Patient Name: Date of Service: Julie Agent, DO RIS 10/05/2022 8:30 A M Medical Record Number: [id number] Patient Account Number: [id number] Date of Birth/Sex: Treating RN: Mar 19, 1923 (89 y.o. Julie Potts Primary Care Provider: Dereck Ligas Other Clinician: Referring Provider: Treating Provider/Extender: Kathi Ludwig Weeks in Treatment: 0 Subjective Chief Complaint Information obtained from Patient Right LE Ulcer History of Present Illness (HPI) 10-05-2022 patient presents today for initial evaluation here at clinic concerning a wound over the right anterior lower leg that has been present since September 03, 2022. The patient tells me that she fell when I ask her how exactly she fell she tells me she does not really know "I just fell". She is 89 years old and really does not want to do anything too aggressive as far as medical intervention is concerned but she does want to obviously get this wound healed. She has been on Doxy for 10 days that is complete at this point she is been using mupirocin topically up to this point. Fortunately I do not see any signs of active infection locally nor systemically which is great news. Patient does have a history of peripheral vascular disease, hypertension, chronic kidney disease stage III. Patient History Information obtained from Patient. Allergies ciprofloxacin (Severity: Moderate), levofloxacin (Severity: Moderate) Social History Never smoker, Marital Status - Widowed, Alcohol Use - Never, Drug Use - No History, Caffeine Use - Never. Medical History Respiratory Patient has  history of Asthma Cardiovascular Patient has history of Hypertension Endocrine Denies history of Type I Diabetes, Type II Diabetes Medical A Surgical History Notes nd Eyes macular degeneration Genitourinary CKD stage 3 Review of Systems (ROS) Ear/Nose/Mouth/Throat Denies complaints or symptoms of Difficult clearing ears, Sinusitis. Hematologic/Lymphatic Denies complaints or symptoms of Bleeding / Clotting Disorders, Human Immunodeficiency Virus. Cardiovascular Denies complaints or symptoms of Chest pain, LE edema. Endocrine KAMERYN, HERTZLER ([id number]) [id number] Page 5 of 8 Complains or has symptoms of Thyroid disease. Genitourinary Denies complaints or symptoms of Kidney failure/ Dialysis. Immunological Denies complaints or symptoms of Hives, Itching. Integumentary (Skin) Denies complaints or symptoms of Wounds, Bleeding or bruising tendency, Breakdown, Swelling. Musculoskeletal Denies complaints or symptoms of Muscle Pain, Muscle Weakness. Neurologic Denies complaints or symptoms of Numbness/parasthesias, Focal/Weakness. Psychiatric Denies complaints or  symptoms of Anxiety, Claustrophobia. Objective Constitutional patient is hypertensive.. pulse regular and within target range for patient.Marland Kitchen respirations regular, non-labored and within target range for patient.Marland Kitchen temperature within target range for patient.. Well-nourished and well-hydrated in no acute distress. Vitals Time Taken: 8:40 AM, Height: 63 in, Source: Stated, Weight: 145 lbs, Source: Stated, BMI: 25.7, Temperature: 97.6 F, Pulse: 61 bpm, Respiratory Rate: 16 breaths/min, Blood Pressure: 152/53 mmHg. Eyes conjunctiva clear no eyelid edema noted. pupils equal round and reactive to light and accommodation. Ears, Nose, Mouth, and Throat no gross abnormality of ear auricles or external auditory canals. normal hearing noted during conversation. mucus membranes moist. Respiratory normal  breathing without difficulty. Cardiovascular Absent posterior tibial and dorsalis pedis pulses bilateral lower extremities. 2+ pitting edema of the bilateral lower extremities. Musculoskeletal normal gait and posture. no significant deformity or arthritic changes, no loss or range of motion, no clubbing. Psychiatric this patient is able to make decisions and demonstrates good insight into disease process. Alert and Oriented x 3. pleasant and cooperative. General Notes: Upon inspection patient's wound bed actually showed signs of good granulation epithelization at this point. Fortunately there does not appear to be any signs of active infection locally nor systemically which is great news. With that being said the wound that she has does appear to be very small she actually seems to have pretty good blood flow I was able to carefully clean this away with saline and gauze today there is no need for sharp debridement and to be honest I think that this will likely do well if we get her edema under control. I would recommend Tubigrip as it will be compatible even if her blood flow is not optimal which it appears it may not be it was showing peripheral arterial disease and noncompressibility on the testing today in the clinic as a screening. And she did have quite a bit of pitting edema noted. Integumentary (Hair, Skin) Wound #1 status is Open. Original cause of wound was Gradually Appeared. The date acquired was: 09/03/2022. The wound is located on the Right,Anterior Lower Leg. The wound measures 1cm length x 1cm width x 0.1cm depth; 0.785cm^2 area and 0.079cm^3 volume. There is no tunneling or undermining noted. There is a medium amount of serosanguineous drainage noted. There is medium (34-66%) red, pink granulation within the wound bed. There is no necrotic tissue within the wound bed. 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 There was a Chemical/Enzymatic/Mechanical debridement performed by Tommie Sams., PA-C. With the following instrument(s): gauze. Other Potts used was gauze. A Moderate amount of bleeding was controlled with Pressure. The procedure was tolerated well. Post Debridement Measurements: 1cm Frate, Neera ([id number]) [phone].pdf Page 6 of 8 length x 1cm width x 0.2cm depth; 0.157cm^3 volume. Character of Wound/Ulcer Post Debridement is stable. Severity of Tissue Post Debridement is: Fat layer exposed. Post procedure Diagnosis Wound #1: Same as Pre-Procedure Plan Follow-up Appointments: Return Appointment in 1 week. Bathing/ Shower/ Hygiene: May shower; gently cleanse wound with antibacterial soap, rinse and pat dry prior to dressing wounds Edema Control - Lymphedema / Segmental Compressive Device / Other: Tubigrip single layer applied. - D WOUND #1: - Lower Leg Wound Laterality:  Right, Anterior Cleanser: Soap and Water 3 x Per Week/30 Days Discharge Instructions: Gently cleanse wound with antibacterial soap, rinse and pat dry prior to dressing wounds Prim Dressing: Hydrofera Blue Ready Transfer Foam, 2.5x2.5 (in/in) 3 x Per Week/30 Days ary Discharge Instructions: Apply Hydrofera Blue Ready to wound bed as directed Secondary Dressing: Conforming Guaze Roll-Large 3 x Per Week/30 Days Discharge Instructions: Apply Conforming Stretch Guaze Bandage as directed Secured With: Medipore T - 6M Medipore H Soft Cloth Surgical T ape ape, 2x2 (in/yd) 3 x Per Week/30 Days 1. I would recommend currently that we have the patient continue to monitor for any signs of  infection or worsening. Obviously based on what I am seeing I do feel like she is headed in the right direction. 2. I am also can recommend that she use Hydrofera Blue I think this would be good for her would use a gauze and roll gauze to secure in place and then subsequently the Tubigrip to help with compression. 3. I am also can recommend she should be elevating her legs much as possible. We will see patient back for reevaluation in 1 week here in the clinic. If anything worsens or changes patient will contact our office for additional recommendations. If she is not doing significantly better with the current regimen she may require actual compression therapy and in that case we may need to get her to vascular for formal arterial testing. Right now she wanted to hold off on that which I am okay with her doing. Will see where things stand at follow-up. Electronic Signature(s) Signed: 10/05/2022 1:45:31 PM By: Worthy Keeler PA-C Entered By: Worthy Keeler on 10/05/2022 13:45:31 -------------------------------------------------------------------------------- ROS/PFSH Details Patient Name: Date of Service: [name], DO RIS 10/05/2022 8:30 A M Medical Record Number: [id number] Patient Account Number: [id number] Date of Birth/Sex: Treating RN: 04/30/1923 (89 y.o. Julie Potts Primary Care Provider: Dereck Ligas Other Clinician: Referring Provider: Treating Provider/Extender: Kathi Ludwig Weeks in Treatment: 0 Information Obtained From Patient Ear/Nose/Mouth/Throat Complaints and Symptoms: Negative for: Difficult clearing ears; Sinusitis Hematologic/Lymphatic Complaints and Symptoms: Negative for: Bleeding / Clotting Disorders; Human Immunodeficiency Virus Cardiovascular Complaints and Symptoms: Negative for: Chest pain; LE edema Medical History: Positive for: Hypertension Endocrine Complaints and Symptoms: Positive for: Thyroid disease Dolinski, Yvanna ([id number])  [id number] Page 7 of 8 Medical History: Negative for: Type I Diabetes; Type II Diabetes Genitourinary Complaints and Symptoms: Negative for: Kidney failure/ Dialysis Medical History: Past Medical History Notes: CKD stage 3 Immunological Complaints and Symptoms: Negative for: Hives; Itching Integumentary (Skin) Complaints and Symptoms: Negative for: Wounds; Bleeding or bruising tendency; Breakdown; Swelling Musculoskeletal Complaints and Symptoms: Negative for: Muscle Pain; Muscle Weakness Neurologic Complaints and Symptoms: Negative for: Numbness/parasthesias; Focal/Weakness Psychiatric Complaints and Symptoms: Negative for: Anxiety; Claustrophobia Eyes Medical History: Past Medical History Notes: macular degeneration Respiratory Medical History: Positive for: Asthma Oncologic Immunizations Pneumococcal Vaccine: Received Pneumococcal Vaccination: Yes Received Pneumococcal Vaccination On or After 60th Birthday: Yes Implantable Devices No devices added Family and Social History Never smoker; Marital Status - Widowed; Alcohol Use: Never; Drug Use: No History; Caffeine Use: Never Electronic Signature(s) Signed: 10/05/2022 1:46:09 PM By: Worthy Keeler PA-C Signed: 10/05/2022 2:53:23 PM By: Rosalio Loud MSN RN CNS WTA Entered By: Rosalio Loud on 10/05/2022 13:32:40 -------------------------------------------------------------------------------- SuperBill Details Patient Name: Date of Service: Julie Agent, DO RIS 10/05/2022 Medical Record Number: [id number] Patient Account Number: [id number] Date of Birth/Sex: Treating RN: 1923/03/31 (89 y.o. Julie Potts [place], [place] ([id number]) [id number] Page  8 of 8 Primary Care Provider: Dereck Ligas Other Clinician: Referring Provider: Treating Provider/Extender: Kathi Ludwig Weeks in Treatment: 0 Diagnosis Coding ICD-10 Codes Code Description I73.89 Other  specified peripheral vascular diseases L97.812 Non-pressure chronic ulcer of other part of right lower leg with fat layer exposed I10 Essential (primary) hypertension N18.30 Chronic kidney disease, stage 3 unspecified Facility Procedures : CPT4 Code: [id number] Description: 99214 - WOUND CARE VISIT-LEV 4 EST PT Modifier: Quantity: 1 Physician Procedures : CPT4 Code Description Modifier [id number] WC PHYS LEVEL 3 NEW PT ICD-10 Diagnosis Description I73.89 Other specified peripheral vascular diseases L97.812 Non-pressure chronic ulcer of other part of right lower leg with fat layer exposed I10 Essential  (primary) hypertension N18.30 Chronic kidney disease, stage 3 unspecified Quantity: 1 Electronic Signature(s) Signed: 10/09/2022 5:33:51 PM By: Gretta Cool, BSN, RN, CWS, Kim RN, BSN Signed: 10/24/2022 10:41:30 AM By: Worthy Keeler PA-C Previous Signature: 10/05/2022 1:45:49 PM Version By: Worthy Keeler PA-C Previous Signature: 10/05/2022 1:33:45 PM Version By: Rosalio Loud MSN RN CNS WTA Entered By: Gretta Cool, BSN, RN, CWS, Kim on 10/09/2022 17:33:51

## 2022-10-08 NOTE — Progress Notes (Signed)
Julie Potts, Julie Potts ([id number]) [id number] Page 1 of 5 Visit Report for 10/05/2022 Abuse Risk Screen Details Patient Name: Date of Service: Julie Agent, DO RIS 10/05/2022 8:30 A M Medical Record Number: [id number] Patient Account Number: [id number] Date of Birth/Sex: Treating RN: 1923/07/21 (89 y.o. [name] Primary Care Kevan Prouty: TO Earley Brooke Other Clinician: Referring Licet Dunphy: Treating Deboraha Goar/Extender: Jeri Cos TO Delane Ginger, CHRISTIE Weeks in Treatment: 0 Abuse Risk Screen Items Answer Electronic Signature(s) Signed: 10/05/2022 1:32:44 PM By: Rosalio Loud MSN RN CNS WTA Entered By: Rosalio Loud on 10/05/2022 13:32:43 -------------------------------------------------------------------------------- Activities of Daily Living Details Patient Name: Date of Service: Julie Potts RIS 10/05/2022 8:30 A M Medical Record Number: [id number] Patient Account Number: [id number] Date of Birth/Sex: Treating RN: July 28, 1923 (89 y.o. [name] Primary Care Fronia Depass: TO Earley Brooke Other Clinician: Referring Palmer Fahrner: Treating Anabeth Chilcott/Extender: Jeri Cos TO N, CHRISTIE Weeks in Treatment: 0 Activities of Daily Living Items Answer Activities of Daily Living (Please select one for each item) Drive Automobile Not Able T Medications ake Completely Able Use T elephone Completely Able Care for Appearance Completely Able Use T oilet Completely Able Bath / Shower Completely Able Dress Self Completely Able Feed Self Completely Able Walk Completely Able Get In / Out Bed Completely Able Housework Completely Able Prepare Meals Completely Able Handle Money Completely Able Shop for Self Completely Julie Potts, Julie Potts ([id number]) [id number] Page 2 of 5 Electronic Signature(s) Signed: 10/05/2022 1:32:48 PM By: Rosalio Loud MSN RN CNS WTA Entered By: Rosalio Loud on 10/05/2022  13:32:48 -------------------------------------------------------------------------------- Education Screening Details Patient Name: Date of Service: Julie Agent, DO RIS 10/05/2022 8:30 A M Medical Record Number: [id number] Patient Account Number: [id number] Date of Birth/Sex: Treating RN: Mar 12, 1923 (89 y.o. [name] Primary Care Gianpaolo Mindel: TO Earley Brooke Other Clinician: Referring Yaquelin Langelier: Treating Yeily Link/Extender: Jeri Cos TO Delane Ginger, CHRISTIE Weeks in Treatment: 0 Learning Preferences/Education Level/Primary Language Learning Preference: Explanation Highest Education Level: College or Above Preferred Language: English Cognitive Barrier Language Barrier: No Translator Needed: No Memory Deficit: No Emotional Barrier: No Cultural/Religious Beliefs Affecting Medical Care: No Physical Barrier Impaired Vision: No Impaired Hearing: No Decreased Hand dexterity: No Knowledge/Comprehension Knowledge Level: High Comprehension Level: High Ability to understand written instructions: High Ability to understand verbal instructions: High Motivation Anxiety Level: Calm Cooperation: Cooperative Education Importance: Acknowledges Need Interest in Health Problems: Asks Questions Perception: Coherent Willingness to Engage in Self-Management High Activities: Readiness to Engage in Self-Management High Activities: Electronic Signature(s) Signed: 10/05/2022 1:32:52 PM By: Rosalio Loud MSN RN CNS WTA Entered By: Rosalio Loud on 10/05/2022 13:32:52 Julie Potts, Julie Potts ([id number] Page 3 of 5 -------------------------------------------------------------------------------- Fall Risk Assessment Details Patient Name: Date of Service: Julie Agent, DO RIS 10/05/2022 8:30 A M Medical Record Number: [id number] Patient Account Number: [id number] Date of Birth/Sex: Treating RN: 1923-06-12 (89 y.o. [name] Primary Care Tiegan Jambor: TO Earley Brooke Other  Clinician: Referring Bravlio Luca: Treating Anjelique Makar/Extender: Jeri Cos TO Delane Ginger, CHRISTIE Weeks in Treatment: 0 Fall Risk Assessment Items Have you had 2 or more falls in the last 12 monthso 0 Yes Have you had any fall that resulted in injury in the last 12 monthso 0 Yes FALLS RISK SCREEN History of falling - immediate or within 3 months 25 Yes Secondary diagnosis (Do you have 2 or more medical diagnoseso) 0 No Ambulatory aid None/bed rest/wheelchair/nurse 0 No Crutches/cane/walker 15 Yes Furniture 0 No Intravenous therapy Access/Saline/Heparin Lock 0 No Gait/Transferring Normal/ bed rest/ wheelchair 0 No Weak (short steps with or without shuffle, stooped  but able to lift head while walking, may seek 0 No support from furniture) Impaired (short steps with shuffle, may have difficulty arising from chair, head down, impaired 0 No balance) Mental Status Oriented to own ability 0 Yes Electronic Signature(s) Signed: 10/05/2022 1:32:56 PM By: Rosalio Loud MSN RN CNS WTA Entered By: Rosalio Loud on 10/05/2022 13:32:56 -------------------------------------------------------------------------------- Foot Assessment Details Patient Name: Date of Service: Julie Agent, DO RIS 10/05/2022 8:30 A M Medical Record Number: [id number] Patient Account Number: [id number] Date of Birth/Sex: Treating RN: Nov 08, 1922 (89 y.o. [name] Primary Care Shadi Sessler: TO Earley Brooke Other Clinician: Referring Maxen Rowland: Treating Brysin Towery/Extender: Jeri Cos TO Delane Ginger, CHRISTIE Weeks in Treatment: 0 Foot Assessment Items Site Locations [place], [place] ([id number]) [id number] Page 4 of 5 + = Sensation present, - = Sensation absent, C = Callus, U = Ulcer R = Redness, W = Warmth, M = Maceration, PU = Pre-ulcerative lesion F = Fissure, S = Swelling, D = Dryness Assessment Right: Left: Other Deformity: No No Prior Foot Ulcer: No No Prior Amputation: No No Charcot Joint: Yes  No Ambulatory Status: Ambulatory With Help Assistance Device: Walker GaitEnergy manager) Signed: 10/05/2022 1:33:04 PM By: Rosalio Loud MSN RN CNS WTA Entered By: Rosalio Loud on 10/05/2022 13:33:04 -------------------------------------------------------------------------------- Nutrition Risk Screening Details Patient Name: Date of Service: Julie Agent, DO RIS 10/05/2022 8:30 A M Medical Record Number: [id number] Patient Account Number: [id number] Date of Birth/Sex: Treating RN: 12/21/1922 (89 y.o. [name] Primary Care Bridie Colquhoun: TO Earley Brooke Other Clinician: Referring Sheli Dorin: Treating Marquesa Rath/Extender: Jeri Cos TO N, CHRISTIE Weeks in Treatment: 0 Height (in): 63 Weight (lbs): 145 Body Mass Index (BMI): 25.7 Nutrition Risk Screening Items Score Screening NUTRITION RISK SCREEN: I have an illness or condition that made me change the kind and/or amount of food I eat 0 No I eat fewer than two meals per day 0 No I eat few fruits and vegetables, or milk products 0 No I have three or more drinks of beer, liquor or wine almost every day 0 No I have tooth or mouth problems that make it hard for me to eat 0 No Julie Potts, Julie Potts ([id number]) [id number] [id number] Page 5 of 5 I don't always have enough money to buy the food I need 0 No I eat alone most of the time 0 No I take three or more different prescribed or over-the-counter drugs a day 1 Yes Without wanting to, I have lost or gained 10 pounds in the last six months 0 No I am not always physically able to shop, cook and/or feed myself 0 No Nutrition Protocols Good Risk Protocol 0 No interventions needed Moderate Risk Protocol High Risk Proctocol Risk Level: Good Risk Score: 1 Electronic Signature(s) Signed: 10/05/2022 1:33:00 PM By: Rosalio Loud MSN RN CNS WTA Entered By: Rosalio Loud on 10/05/2022 13:33:00

## 2022-10-12 DIAGNOSIS — I70238 Atherosclerosis of native arteries of right leg with ulceration of other part of lower right leg: Secondary | ICD-10-CM | POA: Diagnosis not present

## 2022-10-13 NOTE — Progress Notes (Signed)
GENICE, BLUMER ([id number]) [id number] Page 1 of 5 Visit Report for 10/12/2022 Arrival Information Details Patient Name: Date of Service: Julie Potts RIS 10/12/2022 10:00 A M Medical Record Number: [id number] Patient Account Number: [id number] Date of Birth/Sex: Treating RN: 1922/11/02 (89 y.o. Julie Potts Primary Care Ami Thornsberry: Dereck Ligas Other Clinician: Referring Justyne Roell: Treating Monque Haggar/Extender: RO BSO Delane Ginger, MICHA EL Julious Payer Weeks in Treatment: 1 Visit Information History Since Last Visit Added or deleted any medications: No Patient Arrived: Walker Has Dressing in Place as Prescribed: Yes Arrival Time: 10:13 Pain Present Now: No Accompanied By: daughter Transfer Assistance: None Patient Identification Verified: Yes Secondary Verification Process Completed: Yes Patient Requires Transmission-Based Precautions: No Patient Has Alerts: Yes Patient Alerts: Not Diabetic 10/05/22 R L ABI PAD [place] Electronic Signature(s) Signed: 10/12/2022 12:47:02 PM By: Rosalio Loud MSN RN CNS WTA Entered By: Rosalio Loud on 10/12/2022 10:47:29 -------------------------------------------------------------------------------- Clinic Level of Care Assessment Details Patient Name: Date of Service: [name], DO RIS 10/12/2022 10:00 A M Medical Record Number: [id number] Patient Account Number: [id number] Date of Birth/Sex: Treating RN: 1923-07-30 (89 y.o. Julie Potts Primary Care Boubacar Lerette: Dereck Ligas Other Clinician: Referring Quantavius Humm: Treating Tarek Cravens/Extender: RO BSO N, MICHA EL Julious Payer Weeks in Treatment: 1 Clinic Level of Care Assessment Items TOOL 4 Quantity Score X- 1 0 Use when only an EandM is performed on FOLLOW-UP visit ASSESSMENTS - Nursing Assessment / Reassessment X- 1 10 Reassessment of Co-morbidities (includes updates in patient status) X- 1 5 Reassessment of Adherence to Treatment Plan ASSESSMENTS - Wound and Skin A ssessment /  Reassessment X - Simple Wound Assessment / Reassessment - one wound 1 [place], [place] ([id number].pdf Page 2 of 5 '[]'$  - 0 Complex Wound Assessment / Reassessment - multiple wounds '[]'$  - 0 Dermatologic / Skin Assessment (not related to wound area) ASSESSMENTS - Focused Assessment '[]'$  - 0 Circumferential Edema Measurements - multi extremities '[]'$  - 0 Nutritional Assessment / Counseling / Intervention '[]'$  - 0 Lower Extremity Assessment (monofilament, tuning fork, pulses) '[]'$  - 0 Peripheral Arterial Disease Assessment (using hand held doppler) ASSESSMENTS - Ostomy and/or Continence Assessment and Care '[]'$  - 0 Incontinence Assessment and Management '[]'$  - 0 Ostomy Care Assessment and Management (repouching, etc.) PROCESS - Coordination of Care X - Simple Patient / Family Education for ongoing care 1 15 '[]'$  - 0 Complex (extensive) Patient / Family Education for ongoing care X- 1 10 Staff obtains Programmer, systems, Records, T Results / Process Orders est '[]'$  - 0 Staff telephones HHA, Nursing Homes / Clarify orders / etc '[]'$  - 0 Routine Transfer to another Facility (non-emergent condition) '[]'$  - 0 Routine Hospital Admission (non-emergent condition) '[]'$  - 0 New Admissions / Biomedical engineer / Ordering NPWT Apligraf, etc. , '[]'$  - 0 Emergency Hospital Admission (emergent condition) X- 1 10 Simple Discharge Coordination '[]'$  - 0 Complex (extensive) Discharge Coordination PROCESS - Special Needs '[]'$  - 0 Pediatric / Minor Patient Management '[]'$  - 0 Isolation Patient Management '[]'$  - 0 Hearing / Language / Visual special needs '[]'$  - 0 Assessment of Community assistance (transportation, D/C planning, etc.) '[]'$  - 0 Additional assistance / Altered mentation '[]'$  - 0 Support Surface(s) Assessment (bed, cushion, seat, etc.) INTERVENTIONS - Wound Cleansing / Measurement X - Simple Wound Cleansing - one wound 1 5 '[]'$  - 0 Complex Wound Cleansing - multiple wounds '[]'$  -  0 Wound Imaging (photographs - any number of wounds) '[]'$  - 0 Wound Tracing (instead of photographs) X- 1 5 Simple Wound  Measurement - one wound '[]'$  - 0 Complex Wound Measurement - multiple wounds INTERVENTIONS - Wound Dressings X - Small Wound Dressing one or multiple wounds 1 10 '[]'$  - 0 Medium Wound Dressing one or multiple wounds '[]'$  - 0 Large Wound Dressing one or multiple wounds '[]'$  - 0 Application of Medications - topical '[]'$  - 0 Application of Medications - injection INTERVENTIONS - Miscellaneous '[]'$  - 0 External ear exam '[]'$  - 0 Specimen Collection (cultures, biopsies, blood, body fluids, etc.) '[]'$  - 0 Specimen(s) / Culture(s) sent or taken to Lab for analysis [place], Tamela Oddi ([id number]) [phone].pdf Page 3 of 5 '[]'$  - 0 Patient Transfer (multiple staff / Civil Service fast streamer / Similar devices) '[]'$  - 0 Simple Staple / Suture removal (25 or less) '[]'$  - 0 Complex Staple / Suture removal (26 or more) '[]'$  - 0 Hypo / Hyperglycemic Management (close monitor of Blood Glucose) '[]'$  - 0 Ankle / Brachial Index (ABI) - do not check if billed separately X- 1 5 Vital Signs Has the patient been seen at the hospital within the last three years: Yes Total Score: 80 Level Of Care: New/Established - Level 3 Electronic Signature(s) Signed: 10/12/2022 12:47:02 PM By: Rosalio Loud MSN RN CNS WTA Entered By: Rosalio Loud on 10/12/2022 10:49:55 -------------------------------------------------------------------------------- Encounter Discharge Information Details Patient Name: Date of Service: Julie Agent, DO RIS 10/12/2022 10:00 A M Medical Record Number: [id number] Patient Account Number: [id number] Date of Birth/Sex: Treating RN: 11/11/1922 (89 y.o. Julie Potts Primary Care Jeena Arnett: Dereck Ligas Other Clinician: Referring Kaity Pitstick: Treating Ceylon Arenson/Extender: RO BSO Delane Ginger, MICHA EL Norina Buzzard in Treatment: 1 Encounter Discharge Information Items Discharge Condition:  Stable Ambulatory Status: Walker Discharge Destination: Home Transportation: Private Auto Accompanied By: daughter Schedule Follow-up Appointment: Yes Clinical Summary of Care: Electronic Signature(s) Signed: 10/12/2022 12:47:02 PM By: Rosalio Loud MSN RN CNS WTA Entered By: Rosalio Loud on 10/12/2022 10:49:16 -------------------------------------------------------------------------------- Wound Assessment Details Patient Name: Date of Service: Julie Agent, DO RIS 10/12/2022 10:00 A M Medical Record Number: [id number] Patient Account Number: [id number] Date of Birth/Sex: Treating RN: 03-Dec-1922 (89 y.o. Julie Potts Primary Care Romy Mcgue: Dereck Ligas Other Clinician: [name] ([id number]) [id number] Page 4 of 5 Referring Kasyn Rolph: Treating Hawthorne Day/Extender: RO BSO N, MICHA EL Julious Payer Weeks in Treatment: 1 Wound Status Wound Number: 1 Primary Etiology: Arterial Insufficiency Ulcer Wound Location: Right, Anterior Lower Leg Wound Status: Open Wounding Event: Gradually Appeared Comorbid History: Asthma, Hypertension Date Acquired: 09/03/2022 Weeks Of Treatment: 1 Clustered Wound: No Photos Wound Measurements Length: (cm) 1.4 Width: (cm) 0.9 Depth: (cm) 0.1 Area: (cm) 0.99 Volume: (cm) 0.099 % Reduction in Area: -26.1% % Reduction in Volume: -25.3% Epithelialization: Small (1-33%) Wound Description Classification: Full Thickness Without Exposed Support Exudate Amount: Medium Exudate Type: Serosanguineous Exudate Color: red, brown Structures Foul Odor After Cleansing: No Slough/Fibrino No Wound Bed Granulation Amount: Medium (34-66%) Exposed Structure Granulation Quality: Red, Pink Fascia Exposed: No Necrotic Amount: None Present (0%) Fat Layer (Subcutaneous Tissue) Exposed: No Tendon Exposed: No Muscle Exposed: No Joint Exposed: No Bone Exposed: No Treatment Notes Wound #1 (Lower Leg) Wound Laterality: Right,  Anterior Cleanser Soap and Water Discharge Instruction: Gently cleanse wound with antibacterial soap, rinse and pat dry prior to dressing wounds Peri-Wound Care Topical Primary Dressing Hydrofera Blue Ready Transfer Foam, 2.5x2.5 (in/in) Discharge Instruction: Apply Hydrofera Blue Ready to wound bed as directed Secondary Dressing [place] Discharge Instruction: [place] as directed Secured With [place] H Soft  Cloth Surgical T ape ape, 2x2 (in/yd) Compression Wrap Compression Stockings Add-Ons [place], [place] ([id number]) [id number] Page 5 of 5 Electronic Signature(s) Signed: 10/12/2022 12:47:02 PM By: Rosalio Loud MSN RN CNS WTA Entered By: Rosalio Loud on 10/12/2022 10:47:44

## 2022-10-17 NOTE — Progress Notes (Signed)
[name], DEFORREST ([id number]) [id number] Page 1 of 2 Visit Report for 10/12/2022 Physician Orders Details Patient Name: Date of Service: Julie Agent, DO RIS 10/12/2022 10:00 A M Medical Record Number: [id number] Patient Account Number: [id number] Date of Birth/Sex: Treating RN: 09/05/1922 (89 y.o. [name] Primary Care Provider: Dereck Ligas Other Clinician: Referring Provider: Treating Provider/Extender: RO BSO N, MICHA EL Julious Payer Weeks in Treatment: 1 Verbal / Phone Orders: No Diagnosis Coding Follow-up Appointments Return Appointment in 1 week. Bathing/ Shower/ Hygiene May shower; gently cleanse wound with antibacterial soap, rinse and pat dry prior to dressing wounds Edema Control - Lymphedema / Segmental Compressive Device / Other Tubigrip single layer applied. - D Wound Treatment Wound #1 - Lower Leg Wound Laterality: Right, Anterior Cleanser: Soap and Water 3 x Per Week/30 Days Discharge Instructions: Gently cleanse wound with antibacterial soap, rinse and pat dry prior to dressing wounds Prim Dressing: Hydrofera Blue Ready Transfer Foam, 2.5x2.5 (in/in) 3 x Per Week/30 Days ary Discharge Instructions: Apply Hydrofera Blue Ready to wound bed as directed Secondary Dressing: Conforming Guaze Roll-Large 3 x Per Week/30 Days Discharge Instructions: Apply Conforming Stretch Guaze Bandage as directed Secured With: Medipore T - 3M Medipore H Soft Cloth Surgical T ape ape, 2x2 (in/yd) 3 x Per Week/30 Days Electronic Signature(s) Signed: 10/12/2022 12:47:02 PM By: Rosalio Loud MSN RN CNS WTA Signed: 10/17/2022 8:52:27 AM By: Linton Ham MD Entered By: Rosalio Loud on 10/12/2022 10:48:23 -------------------------------------------------------------------------------- SuperBill Details Patient Name: Date of Service: Julie Agent, DO RIS 10/12/2022 Medical Record Number: [id number] Patient Account Number: [id number] Date of Birth/Sex: Treating  RN: Feb 03, 1923 (89 y.o. [name] [place], [place] ([id number]) [id number] Page 2 of 2 Primary Care Provider: Dereck Ligas Other Clinician: Referring Provider: Treating Provider/Extender: RO BSO N, MICHA EL Julious Payer Weeks in Treatment: 1 Diagnosis Coding ICD-10 Codes Code Description I73.89 Other specified peripheral vascular diseases L97.812 Non-pressure chronic ulcer of other part of right lower leg with fat layer exposed I10 Essential (primary) hypertension N18.30 Chronic kidney disease, stage 3 unspecified Facility Procedures : CPT4 Code: [id number] Description: [id number] - WOUND CARE VISIT-LEV 3 EST PT Modifier: Quantity: 1 Electronic Signature(s) Signed: 10/12/2022 12:47:02 PM By: Rosalio Loud MSN RN CNS WTA Signed: 10/17/2022 8:52:27 AM By: Linton Ham MD Entered By: Rosalio Loud on 10/12/2022 10:50:01

## 2022-10-26 ENCOUNTER — Encounter: Payer: Medicare PPO | Admitting: Physician Assistant

## 2022-10-26 DIAGNOSIS — I70238 Atherosclerosis of native arteries of right leg with ulceration of other part of lower right leg: Secondary | ICD-10-CM | POA: Diagnosis not present

## 2022-10-27 NOTE — Progress Notes (Signed)
Julie Potts ([id number]) [id number] Page 1 of 6 Visit Report for 10/26/2022 Chief Complaint Document Details Patient Name: Date of Service: Julie Agent, DO RIS 10/26/2022 10:00 A M Medical Record Number: [id number] Patient Account Number: [id number] Date of Birth/Sex: Treating RN: 1922/10/04 (89 y.o. Julie Potts Primary Care Provider: Dereck Potts Other Clinician: Referring Provider: Treating Provider/Extender: Julie Potts in Treatment: 3 Information Obtained from: Patient Chief Complaint Right LE Ulcer Electronic Signature(s) Signed: 10/26/2022 2:10:36 PM By: Worthy Keeler PA-C Signed: 10/29/2022 4:41:57 PM By: Rosalio Loud MSN RN CNS WTA Previous Signature: 10/26/2022 10:53:45 AM Version By: Worthy Keeler PA-C Entered By: Rosalio Loud on 10/26/2022 11:05:54 -------------------------------------------------------------------------------- Debridement Details Patient Name: Date of Service: Julie Agent, DO RIS 10/26/2022 10:00 A M Medical Record Number: [id number] Patient Account Number: [id number] Date of Birth/Sex: Treating RN: [birth year] (89 y.o. Julie Potts Primary Care Provider: Dereck Potts Other Clinician: Referring Provider: Treating Provider/Extender: Julie Potts in Treatment: 3 Debridement Performed for Assessment: Wound #1 Right,Anterior Lower Leg Performed By: Physician Julie Potts., PA-C Debridement Type: Debridement Severity of Tissue Pre Debridement: Fat layer exposed Level of Consciousness (Pre-procedure): Awake and Alert Pre-procedure Verification/Time Out No Taken: Start Time: 10:57 T Area Debrided (L x W): otal 0.7 (cm) x 0.8 (cm) = 0.56 (cm) Tissue and other material debrided: Viable, Non-Viable, Skin: Dermis , Skin: Epidermis Level: Skin/Epidermis Debridement Description: Selective/Open Wound Instrument: Curette Bleeding: Minimum Hemostasis Achieved: Pressure Response to  Treatment: Procedure was tolerated well Level of Consciousness (Post- Awake and Alert procedure): Post Debridement Measurements of Total Wound Length: (cm) 0.7 Width: (cm) 0.8 Depth: (cm) 0.2 Volume: (cm) 0.088 Character of Wound/Ulcer Post Debridement: Stable Severity of Tissue Post Debridement: Limited to breakdown of skin Post Procedure Diagnosis Same as Pre-procedure Electronic Signature(s) Signed: 10/26/2022 2:10:36 PM By: Worthy Keeler PA-C Signed: 10/29/2022 4:41:57 PM By: Rosalio Loud MSN RN CNS WTA Entered By: Rosalio Loud on 10/26/2022 11:00:25 Julie Potts ([id number]) [id number] Page 2 of 6 -------------------------------------------------------------------------------- HPI Details Patient Name: Date of Service: Julie Agent, DO RIS 10/26/2022 10:00 A M Medical Record Number: [id number] Patient Account Number: [id number] Date of Birth/Sex: Treating RN: [birth year] (89 y.o. Julie Potts Primary Care Provider: Dereck Potts Other Clinician: Referring Provider: Treating Provider/Extender: Julie Potts in Treatment: 3 History of Present Illness HPI Description: 10-05-2022 patient presents today for initial evaluation here at clinic concerning a wound over the right anterior lower leg that has been present since September 03, 2022. The patient tells me that she fell when I ask her how exactly she fell she tells me she does not really know "I just fell". She is 89 years old and really does not want to do anything too aggressive as far as medical intervention is concerned but she does want to obviously get this wound healed. She has been on Doxy for 10 days that is complete at this point she is been using mupirocin topically up to this point. Fortunately I do not see any signs of active infection locally nor systemically which is great news. Patient does have a history of peripheral vascular disease, hypertension, chronic kidney  disease stage III. 10-26-2022 upon evaluation today patient appears to be doing better in regard to her wound. This is actually showing signs of improvement and I am actually very pleased with where things stand currently. I do not see any signs of active infection locally nor systemically which is great news. No fevers, chills, nausea,  vomiting, or diarrhea. Electronic Signature(s) Signed: 10/30/2022 6:23:59 PM By: Worthy Keeler PA-C Entered By: Worthy Keeler on 10/30/2022 18:23:59 -------------------------------------------------------------------------------- Physical Exam Details Patient Name: Date of Service: [place], DO RIS 10/26/2022 10:00 A M Medical Record Number: [id number] Patient Account Number: [id number] Date of Birth/Sex: Treating RN: 16-Mar-1923 (89 y.o. Julie Potts Primary Care Provider: Dereck Potts Other Clinician: Referring Provider: Treating Provider/Extender: Julie Potts in Treatment: 3 Constitutional Well-nourished and well-hydrated in no acute distress. Respiratory normal breathing without difficulty. Psychiatric this patient is able to make decisions and demonstrates good insight into disease process. Alert and Oriented x 3. pleasant and cooperative. Notes I did perform debridement clearway some of the necrotic debris and postdebridement this is actually dramatically improved which is great news I think that she is on the right track here this is awesome. Electronic Signature(s) Signed: 10/30/2022 6:24:13 PM By: Worthy Keeler PA-C Entered By: Worthy Keeler on 10/30/2022 18:24:13 -------------------------------------------------------------------------------- Physician Orders Details Patient Name: Date of Service: [place], DO RIS 10/26/2022 10:00 A M Medical Record Number: [id number] Patient Account Number: [id number] Date of Birth/Sex: Treating RN: [birth year] (89 y.o. Julie Potts Primary Care Provider: Dereck Potts Other  Clinician: Referring Provider: Treating Provider/Extender: Julie Potts in Treatment: 3 Verbal / Phone Orders: No Diagnosis Coding ICD-10 Coding Julie Potts, Julie Potts ([id number]) [id number] Page 3 of 6 Code Description I73.89 Other specified peripheral vascular diseases L97.812 Non-pressure chronic ulcer of other part of right lower leg with fat layer exposed I10 Essential (primary) hypertension N18.30 Chronic kidney disease, stage 3 unspecified Follow-up Appointments Return Appointment in 1 week. Bathing/ Shower/ Hygiene May shower; gently cleanse wound with antibacterial soap, rinse and pat dry prior to dressing wounds Edema Control - Lymphedema / Segmental Compressive Device / Other Tubigrip single layer applied. - D Wound Treatment Wound #1 - Lower Leg Wound Laterality: Right, Anterior Cleanser: Soap and Water 3 x Per Week/30 Days Discharge Instructions: Gently cleanse wound with antibacterial soap, rinse and pat dry prior to dressing wounds Topical: Vitamin AandD Ointment, 4 (oz) tube 3 x Per Week/30 Days Prim Dressing: Hydrofera Blue Ready Transfer Foam, 2.5x2.5 (in/in) 3 x Per Week/30 Days ary Discharge Instructions: Apply Hydrofera Blue Ready to wound bed as directed Secondary Dressing: Conforming Guaze Roll-Large 3 x Per Week/30 Days Discharge Instructions: Apply Conforming Stretch Guaze Bandage as directed Secured With: Medipore T - 68M Medipore H Soft Cloth Surgical T ape ape, 2x2 (in/yd) 3 x Per Week/30 Days Electronic Signature(s) Signed: 10/26/2022 2:10:36 PM By: Worthy Keeler PA-C Signed: 10/29/2022 4:41:57 PM By: Rosalio Loud MSN RN CNS WTA Entered By: Rosalio Loud on 10/26/2022 13:13:08 -------------------------------------------------------------------------------- Problem List Details Patient Name: Date of Service: Julie Agent, DO RIS 10/26/2022 10:00 A M Medical Record Number: [id number] Patient Account Number:  [id number] Date of Birth/Sex: Treating RN: Mar 17, 1923 (89 y.o. 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 layer 10/05/2022 No Yes exposed [place] (primary) hypertension 10/05/2022 No Yes N18.30 Chronic kidney disease, stage 3 unspecified 10/05/2022 No Yes Inactive Problems Julie Potts, Julie Potts ([id number]) [id number] Page 4 of 6 Resolved Problems Electronic Signature(s) Signed: 10/26/2022 2:10:36 PM By: Worthy Keeler PA-C Signed: 10/29/2022 4:41:57 PM By: Rosalio Loud MSN  RN CNS WTA Previous Signature: 10/26/2022 10:53:13 AM Version By: Worthy Keeler PA-C Entered By: Rosalio Loud on 10/26/2022 11:05:49 -------------------------------------------------------------------------------- Progress Note Details Patient Name: Date of Service: Julie [name], DO RIS 10/26/2022 10:00 A M Medical Record Number: [id number] Patient Account Number: [id number] Date of Birth/Sex: Treating RN: [birth year] (89 y.o. Julie Potts Primary Care Provider: Dereck Potts Other Clinician: Referring Provider: Treating Provider/Extender: Julie Potts in Treatment: 3 Subjective Chief Complaint Information obtained from Patient Right LE Ulcer History of Present Illness (HPI) 10-05-2022 patient presents today for initial evaluation here at clinic concerning a wound over the right anterior lower leg that has been present since September 03, 2022. The patient tells me that she fell when I ask her how exactly she fell she tells me she does not really know "I just fell". She is 89 years old and really does not want to do anything too  aggressive as far as medical intervention is concerned but she does want to obviously get this wound healed. She has been on Doxy for 10 days that is complete at this point she is been using mupirocin topically up to this point. Fortunately I do not see any signs of active infection locally nor systemically which is great news. Patient does have a history of peripheral vascular disease, hypertension, chronic kidney disease stage III. 10-26-2022 upon evaluation today patient appears to be doing better in regard to her wound. This is actually showing signs of improvement and I am actually very pleased with where things stand currently. I do not see any signs of active infection locally nor systemically which is great news. No fevers, chills, nausea, vomiting, or diarrhea. Objective Constitutional Well-nourished and well-hydrated in no acute distress. Vitals Time Taken: 10:35 AM, Height: 63 in, Weight: 145 lbs, BMI: 25.7, Temperature: 97.4 F, Pulse: 61 bpm, Respiratory Rate: 16 breaths/min, Blood Pressure: 118/65 mmHg. Respiratory normal breathing without difficulty. Psychiatric this patient is able to make decisions and demonstrates good insight into disease process. Alert and Oriented x 3. pleasant and cooperative. General Notes: I did perform debridement clearway some of the necrotic debris and postdebridement this is actually dramatically improved which is great news I think that she is on the right track here this is awesome. Integumentary (Hair, Skin) Wound #1 status is Open. Original cause of wound was Gradually Appeared. The date acquired was: 09/03/2022. The wound has been in treatment 3 Potts. The wound is located on the Right,Anterior Lower Leg. The wound measures 0.7cm length x 0.8cm width x 0.1cm depth; 0.44cm^2 area and 0.044cm^3 volume. There is a medium amount of serosanguineous drainage noted. There is medium (34-66%) red, pink granulation within the wound bed. There is no necrotic  tissue within the wound bed. 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 There was a Selective/Open Wound Skin/Epidermis Debridement with a total area of 0.56 sq cm performed by Julie Potts., PA-C. With the following instrument(s): Curette to remove Viable and Non-Viable tissue/material. Material removed includes Skin: Dermis and Skin: Epidermis and. No specimens were taken.A Minimum amount of bleeding was controlled with Pressure. The procedure was tolerated well. Post Debridement  Measurements: 0.7cm length x 0.8cm width x 0.2cm depth; 0.088cm^3 volume. Character of Wound/Ulcer Post Debridement is stable. Severity of Tissue Post Debridement is: Limited to breakdown of skin. Post procedure Diagnosis Wound #1: Same as Pre-Procedure Plan Follow-up Appointments: Return Appointment in 1 week. Bathing/ Shower/ Hygiene: May shower; gently cleanse wound with antibacterial soap, rinse and pat dry prior to dressing wounds Edema Control - Lymphedema / Segmental Compressive Device / Other: Tubigrip single layer applied. - D WOUND #1: - Lower Leg Wound Laterality: Right, Anterior Cleanser: Soap and Water 3 x Per Week/30 Days Discharge Instructions: Gently cleanse wound with antibacterial soap, rinse and pat dry prior to dressing wounds Topical: Vitamin AandD Ointment, 4 (oz) tube 3 x Per Week/30 Days Prim Dressing: Hydrofera Blue Ready Transfer Foam, 2.5x2.5 (in/in) 3 x Per Week/30 Days ary Discharge Instructions: Apply Hydrofera Blue  Ready to wound bed as directed Secondary Dressing: Conforming Guaze Roll-Large 3 x Per Week/30 Days Discharge Instructions: Apply Conforming Stretch Guaze Bandage as directed Secured With: Medipore T - 55M Medipore H Soft Cloth Surgical T ape ape, 2x2 (in/yd) 3 x Per Week/30 Days 1. I would recommend that we have the patient continue to monitor for any signs of infection or worsening. Based on what I am seeing I do believe that we are headed in the right direction. 2. Also, suggest the patient should continue with the AandD ointment followed by the [facility] which should prevent anything from sticking around the edges of the wound I think the [facility] is doing a good job. We will see patient back for reevaluation in 1 week here in the clinic. If anything worsens or changes patient will contact our office for additional recommendations. Electronic Signature(s) Signed: 10/30/2022 6:24:34 PM By: Worthy Keeler PA-C Entered By: Worthy Keeler on 10/30/2022 18:24:34 -------------------------------------------------------------------------------- SuperBill Details Patient Name: Date of Service: Julie Agent, DO RIS 10/26/2022 Medical Record Number: [id number] Patient Account Number: [id number] Date of Birth/Sex: Treating RN: 07/24/1923 (89 y.o. Julie Potts Primary Care Provider: Dereck Potts Other Clinician: Referring Provider: Treating Provider/Extender: Julie Potts in Treatment: 3 Diagnosis Coding ICD-10 Codes Code Description I73.89 Other specified peripheral vascular diseases L97.812 Non-pressure chronic ulcer of other part of right lower leg with fat layer exposed I10 Essential (primary) hypertension N18.30 Chronic kidney disease, stage 3 unspecified Facility Procedures TYAISHA, MISHKIN ([id number]): CPT4 Code Description [id number] - DEBRIDE WOUND 1ST 20 SQ CM OR < ICD-10 Diagnosis Description [id number] Non-pressure chronic ulcer of other part of right  lower leg with fa [id number] Page 6 of 6: Modifier Quantity 1 t layer exposed Physician Procedures : CPT4 Code Description Modifier [id number] - WC PHYS DEBR WO ANESTH 20 SQ CM ICD-10 Diagnosis Description [id number] Non-pressure chronic ulcer of other part of right lower leg with fat layer exposed Quantity: 1 Electronic Signature(s) Signed: 10/30/2022 6:26:06 PM By: Worthy Keeler PA-C Entered By: Worthy Keeler on 10/30/2022 18:26:06

## 2022-10-29 NOTE — Progress Notes (Signed)
YULY, ROHLFING ([id number]) [id number] Page 1 of 7 Visit Report for 10/26/2022 Arrival Information Details Patient Name: Date of Service: Julie Potts RIS 10/26/2022 10:00 [place] Record Number: [id number] Patient Account Number: [id number] Date of Birth/Sex: Treating RN: March 25, 1923 (89 y.o. [name] Primary Care Emery Binz: Dereck Ligas Other Clinician: Referring Liboria Putnam: Treating Enrrique Mierzwa/Extender: Kathi Ludwig Weeks in Treatment: 3 Visit Information History Since Last Visit Added or deleted any medications: No Patient Arrived: [place] Hospitalized since last visit: Yes Arrival Time: 10:34 Has Dressing in Place as Prescribed: Yes Accompanied By: daughter Pain Present Now: No Transfer Assistance: None Patient Identification Verified: Yes Secondary Verification Process Completed: Yes Patient Requires Transmission-Based Precautions: No Patient Has Alerts: Yes Patient Alerts: Not Diabetic 10/05/22 R L ABI PAD [place] Electronic Signature(s) Signed: 10/26/2022 1:12:39 PM By: Rosalio Loud MSN RN CNS WTA Entered By: Rosalio Loud on 10/26/2022 13:12:39 -------------------------------------------------------------------------------- Clinic Level of Care Assessment Details Patient Name: Date of Service: Julie Agent, DO RIS 10/26/2022 10:00 A M Medical Record Number: [id number] Patient Account Number: [id number] Date of Birth/Sex: Treating RN: 1922/10/03 (89 y.o. [name] Primary Care Dalonte Hardage: Dereck Ligas Other Clinician: Referring Atley Neubert: Treating Jalen Daluz/Extender: Kathi Ludwig Weeks in Treatment: 3 Clinic Level of Care Assessment Items TOOL 1 Quantity Score []  - 0 Use when EandM and Procedure is performed on INITIAL visit ASSESSMENTS - Nursing Assessment / Reassessment []  - 0 General Physical Exam (combine w/ comprehensive assessment (listed just below) when performed on new pt. evals) []  - 0 Comprehensive  Assessment (HX, ROS, Risk Assessments, Wounds Hx, etc.) ASSESSMENTS - Wound and Skin Assessment / Reassessment []  - 0 Dermatologic / Skin Assessment (not related to wound area) ASSESSMENTS - Ostomy and/or Continence Assessment and Care []  - 0 Incontinence Assessment and Management []  - 0 Ostomy Care Assessment and Management (repouching, etc.) PROCESS - Coordination of Care []  - 0 Simple Patient / Family Education for ongoing care []  - 0 Complex (extensive) Patient / Family Education for ongoing care []  - 0 Staff obtains Programmer, systems, Records, T Results / Process Orders est []  - 0 Staff telephones HHA, Nursing Homes / Clarify orders / etc []  - 0 Routine Transfer to another Facility (non-emergent condition) []  - 0 Routine Hospital Admission (non-emergent condition) []  - 0 New Admissions / Insurance Authorizations / Ordering NPWT Apligraf, etc. , []  - 0 Emergency Hospital Admission (emergent condition) [place], [place] ([id number]) [id number] Page 2 of 7 PROCESS - Special Needs []  - 0 Pediatric / Minor Patient Management []  - 0 Isolation Patient Management []  - 0 Hearing / Language / Visual special needs []  - 0 Assessment of Community assistance (transportation, D/C planning, etc.) []  - 0 Additional assistance / Altered mentation []  - 0 Support Surface(s) Assessment (bed, cushion, seat, etc.) INTERVENTIONS - Miscellaneous []  - 0 External ear exam []  - 0 Patient Transfer (multiple staff / Civil Service fast streamer / Similar devices) []  - 0 Simple Staple / Suture removal (25 or less) []  - 0 Complex Staple / Suture removal (26 or more) []  - 0 Hypo/Hyperglycemic Management (do not check if billed separately) []  - 0 Ankle / Brachial Index (ABI) - do not check if billed separately Has the patient been seen at the hospital within the last three years: Yes Total Score: 0 Level Of Care: ____ Electronic Signature(s) Signed: 10/29/2022 4:41:57 PM By: Rosalio Loud  MSN RN CNS WTA Entered By: Rosalio Loud on 10/26/2022 13:13:14 -------------------------------------------------------------------------------- Encounter Discharge Information Details Patient Name: Date of Service: Julie  [place], DO RIS 10/26/2022 10:00 A M Medical Record Number: [id number] Patient Account Number: [id number] Date of Birth/Sex: Treating RN: 09-26-1922 (89 y.o. [name] Primary Care Celeste Candelas: Dereck Ligas Other Clinician: Referring Truth Barot: Treating Crisol Muecke/Extender: Kathi Ludwig Weeks in Treatment: 3 Encounter Discharge Information Items Post Procedure Vitals Discharge Condition: Stable Temperature (F): 97.4 Ambulatory Status: [place] Pulse (bpm): 61 Discharge Destination: Home Respiratory Rate (breaths/min): 16 Transportation: Private Auto Blood Pressure (mmHg): 118/65 Accompanied By: daughter Schedule Follow-up Appointment: Yes Clinical Summary of Care: Electronic Signature(s) Signed: 10/26/2022 1:13:42 PM By: Rosalio Loud MSN RN CNS WTA Entered By: Rosalio Loud on 10/26/2022 13:13:42 -------------------------------------------------------------------------------- Lower Extremity Assessment Details Patient Name: Date of Service: Julie Agent, DO RIS 10/26/2022 10:00 A M Medical Record Number: [id number] Patient Account Number: [id number] Date of Birth/Sex: Treating RN: 1922-10-12 (89 y.o. [name] Primary Care Randie Bloodgood: Dereck Ligas Other Clinician: Referring Azad Calame: Treating Mercedes Fort/Extender: Kathi Ludwig Weeks in Treatment: 3 Edema Assessment Assessed: [Left: No] Patrice Paradise: No] [Left: Edema] Patrice Paradise: :] R[LeftCindee Salt [id number] [Right: [id number] Page 3 of 7] Calf Left: Right: Point of Measurement: 32 cm From Medial Instep 34 cm Ankle Left: Right: Point of Measurement: 12 cm From Medial Instep 25 cm Knee To Floor Left: Right: From Medial Instep 40 cm Vascular  Assessment Pulses: Dorsalis Pedis Palpable: [Right:Yes] Electronic Signature(s) Signed: 10/29/2022 4:41:57 PM By: Rosalio Loud MSN RN CNS WTA Entered By: Rosalio Loud on 10/26/2022 10:43:23 -------------------------------------------------------------------------------- Multi Wound Chart Details Patient Name: Date of Service: Julie Agent, DO RIS 10/26/2022 10:00 A M Medical Record Number: [id number] Patient Account Number: [id number] Date of Birth/Sex: Treating RN: 19-Nov-1922 (89 y.o. [name] Primary Care Camille Thau: Dereck Ligas Other Clinician: Referring Landy Dunnavant: Treating Kareena Arrambide/Extender: Kathi Ludwig Weeks in Treatment: 3 Vital Signs Height(in): 63 Pulse(bpm): 61 Weight(lbs): 145 Blood Pressure(mmHg): 118/65 Body Mass Index(BMI): 25.7 Temperature(F): 97.4 Respiratory Rate(breaths/min): 16 [1:Photos:] [N/A:N/A] Right, Anterior Lower Leg N/A N/A Wound Location: Gradually Appeared N/A N/A Wounding Event: Arterial Insufficiency Ulcer N/A N/A Primary Etiology: Asthma, Hypertension N/A N/A Comorbid History: 09/03/2022 N/A N/A Date Acquired: 3 N/A N/A Weeks of Treatment: Open N/A N/A Wound Status: No N/A N/A Wound Recurrence: 0.7x0.8x0.1 N/A N/A Measurements L x W x D (cm) 0.44 N/A N/A A (cm) : rea 0.044 N/A N/A Volume (cm) : 43.90% N/A N/A % Reduction in A rea: 44.30% N/A N/A % Reduction in Volume: Full Thickness Without Exposed N/A N/A Classification: Support Structures Medium N/A N/A Exudate Amount: Serosanguineous N/A N/A Exudate Type: red, brown N/A N/A Exudate Color: Medium (34-66%) N/A N/A Granulation Amount: Red, Pink N/A N/A Granulation Quality: None Present (0%) N/A N/A Necrotic Amount: Fascia: No N/A N/A Exposed Structures: Goins, Julie Potts ([id number]) [id number] Page 4 of 7 Fat Layer (Subcutaneous Tissue): No Tendon: No Muscle: No Joint: No Bone: No Small (1-33%) N/A  N/A Epithelialization: Debridement - Selective/Open Wound N/A N/A Debridement: Skin/Epidermis N/A N/A Level: 0.56 N/A N/A Debridement A (sq cm): rea Curette N/A N/A Instrument: Minimum N/A N/A Bleeding: Pressure N/A N/A Hemostasis A chieved: Debridement Treatment Response: Procedure was tolerated well N/A N/A Post Debridement Measurements L x 0.7x0.8x0.2 N/A N/A W x D (cm) 0.088 N/A N/A Post Debridement Volume: (cm) Debridement N/A N/A Procedures Performed: Treatment Notes Wound #1 (Lower Leg) Wound Laterality: Right, Anterior Cleanser Soap and Water Discharge Instruction: Gently cleanse wound with antibacterial soap, rinse and pat dry prior to dressing wounds Peri-Wound Care Topical Vitamin AandD Ointment, 4 (oz) tube Primary Dressing Hydrofera Blue  Ready Transfer Foam, 2.5x2.5 (in/in) Discharge Instruction: Apply Hydrofera Blue Ready to wound bed as directed Secondary Dressing [place] Discharge Instruction: Apply Conforming Stretch Guaze Bandage as directed Secured With [facility] T ape ape, 2x2 (in/yd) Compression Wrap Compression Stockings Add-Ons Electronic Signature(s) Signed: 10/26/2022 1:12:52 PM By: Rosalio Loud MSN RN CNS WTA Entered By: Rosalio Loud on 10/26/2022 13:12:52 -------------------------------------------------------------------------------- Pain Assessment Details Patient Name: Date of Service: Julie Agent, DO RIS 10/26/2022 10:00 A M Medical Record Number: [id number] Patient Account Number: [id number] Date of Birth/Sex: Treating RN: 08/24/1922 (89 y.o. [name] Primary Care Sharifa Bucholz: Dereck Ligas Other Clinician: Referring Butch Otterson: Treating Cloma Rahrig/Extender: Kathi Ludwig Weeks in Treatment: 3 Active Problems Location of Pain Severity and Description of Pain Patient Has Paino No Site Locations [place], [place] ([id number]) [id number]  Page 5 of 7 Pain Management and Medication Current Pain Management: Electronic Signature(s) Signed: 10/29/2022 4:41:57 PM By: Rosalio Loud MSN RN CNS WTA Entered By: Rosalio Loud on 10/26/2022 10:37:27 -------------------------------------------------------------------------------- Patient/Caregiver Education Details Patient Name: Date of Service: Julie Agent, DO RIS 3/22/2024andnbsp10:00 A M Medical Record Number: [id number] Patient Account Number: [id number] Date of Birth/Gender: Treating RN: March 21, 1923 (89 y.o. [name] Primary Care Physician: Dereck Ligas Other Clinician: Referring Physician: Treating Physician/Extender: Jesse Fall in Treatment: 3 Education Assessment Education Provided To: Patient Education Topics Provided Wound/Skin Impairment: Handouts: Caring for Your Ulcer Methods: Explain/Verbal Responses: State content correctly Electronic Signature(s) Signed: 10/29/2022 4:41:57 PM By: Rosalio Loud MSN RN CNS WTA Entered By: Rosalio Loud on 10/26/2022 11:05:33 -------------------------------------------------------------------------------- Wound Assessment Details Patient Name: Date of Service: Julie Agent, DO RIS 10/26/2022 10:00 A M Medical Record Number: [id number] Patient Account Number: [id number] Date of Birth/Sex: Treating RN: [birth year] (89 y.o. [name] Primary Care Dreyson Mishkin: Dereck Ligas Other Clinician: Referring Khylie Larmore: Treating Maxamillian Tienda/Extender: Kathi Ludwig Weeks in Treatment: 3 Wound Status Wound Number: 1 Primary Etiology: Arterial Insufficiency Ulcer Wound Location: Right, Anterior Lower Leg Wound Status: Open Wounding Event: Gradually Appeared Comorbid History: Asthma, Hypertension Date Acquired: 09/03/2022 [facility] Of Treatment: [place], Julie Potts ([id number]) [id number] Page 6 of 7 Clustered Wound: No Photos Wound Measurements Length: (cm) 0.7 Width: (cm) 0.8 Depth:  (cm) 0.1 Area: (cm) 0.44 Volume: (cm) 0.044 % Reduction in Area: 43.9% % Reduction in Volume: 44.3% Epithelialization: Small (1-33%) Wound Description Classification: Full Thickness Without Exposed Support Structures Exudate Amount: Medium Exudate Type: Serosanguineous Exudate Color: red, brown Foul Odor After Cleansing: No Slough/Fibrino No Wound Bed Granulation Amount: Medium (34-66%) Exposed Structure Granulation Quality: Red, Pink Fascia Exposed: No Necrotic Amount: None Present (0%) Fat Layer (Subcutaneous Tissue) Exposed: No Tendon Exposed: No Muscle Exposed: No Joint Exposed: No Bone Exposed: No Treatment Notes Wound #1 (Lower Leg) Wound Laterality: Right, Anterior Cleanser Soap and Water Discharge Instruction: Gently cleanse wound with antibacterial soap, rinse and pat dry prior to dressing wounds Peri-Wound Care Topical Vitamin AandD Ointment, 4 (oz) tube Primary Dressing Hydrofera Blue Ready Transfer Foam, 2.5x2.5 (in/in) Discharge Instruction: Apply Hydrofera Blue Ready to wound bed as directed Secondary Dressing [place] Discharge Instruction: Apply Conforming Stretch Guaze Bandage as directed Secured With [place] [facility] Soft Cl[facility] Surgical T ape ape, 2x2 (in/yd) Compression Wrap Compression Stockings Environmental education officer) Signed: 10/29/2022 4:41:57 PM By: Rosalio Loud MSN RN CNS WTA Entered By: Rosalio Loud on 10/26/2022 10:42:51 Julie Potts, Julie Potts ([id number]) [id number] Page 7 of 7 -------------------------------------------------------------------------------- Vitals Details Patient Name: Date of Service:  Julie SBERRY, DO RIS 10/26/2022 10:00 A M Medical Record Number: [id number] Patient Account Number: [id number] Date of Birth/Sex: Treating RN: Jan 03, 1923 (89 y.o. [name] Primary Care Nikeya Maxim: Dereck Ligas Other Clinician: Referring Jaquaya Coyle: Treating Freemon Binford/Extender:  Kathi Ludwig Weeks in Treatment: 3 Vital Signs Time Taken: 10:35 Temperature (F): 97.4 Height (in): 63 Pulse (bpm): 61 Weight (lbs): 145 Respiratory Rate (breaths/min): 16 Body Mass Index (BMI): 25.7 Blood Pressure (mmHg): 118/65 Reference Range: 80 - 120 mg / dl Electronic Signature(s) Signed: 10/29/2022 4:41:57 PM By: Rosalio Loud MSN RN CNS WTA Entered By: Rosalio Loud on 10/26/2022 10:37:22

## 2022-11-06 ENCOUNTER — Encounter: Payer: Medicare PPO | Attending: Physician Assistant | Admitting: Physician Assistant

## 2022-11-06 DIAGNOSIS — N183 Chronic kidney disease, stage 3 unspecified: Secondary | ICD-10-CM | POA: Insufficient documentation

## 2022-11-06 DIAGNOSIS — I129 Hypertensive chronic kidney disease with stage 1 through stage 4 chronic kidney disease, or unspecified chronic kidney disease: Secondary | ICD-10-CM | POA: Diagnosis not present

## 2022-11-06 DIAGNOSIS — L97812 Non-pressure chronic ulcer of other part of right lower leg with fat layer exposed: Secondary | ICD-10-CM | POA: Diagnosis not present

## 2022-11-06 DIAGNOSIS — W19XXXA Unspecified fall, initial encounter: Secondary | ICD-10-CM | POA: Insufficient documentation

## 2022-11-06 DIAGNOSIS — I7389 Other specified peripheral vascular diseases: Secondary | ICD-10-CM | POA: Insufficient documentation

## 2022-11-06 NOTE — Progress Notes (Addendum)
Julie, Potts ([id number]) [id number] Page 1 of 5 Visit Report for 11/06/2022 Chief Complaint Document Details Patient Name: Date of Service: Julie Potts RIS 11/06/2022 10:15 A M Medical Record Number: [id number] Patient Account Number: [id number] Date of Birth/Sex: Treating RN: 1923/04/02 (89 y.o. Julie Potts Primary Care Provider: Dereck Potts Other Clinician: Referring Provider: Treating Provider/Extender: Kathi Ludwig Weeks in Treatment: 4 Information Obtained from: Patient Chief Complaint Right LE Ulcer Electronic Signature(s) Signed: 11/06/2022 11:09:36 AM By: Worthy Keeler PA-C Entered By: Worthy Keeler on 11/06/2022 11:09:36 -------------------------------------------------------------------------------- HPI Details Patient Name: Date of Service: Julie [name], DO RIS 11/06/2022 10:15 A M Medical Record Number: [id number] Patient Account Number: [id number] Date of Birth/Sex: Treating RN: 09-07-1922 (89 y.o. Julie Potts Primary Care Provider: Dereck Potts Other Clinician: Referring Provider: Treating Provider/Extender: Kathi Ludwig Weeks in Treatment: 4 History of Present Illness HPI Description: 10-05-2022 patient presents today for initial evaluation here at clinic concerning a wound over the right anterior lower leg that has been present since September 03, 2022. The patient tells me that she fell when I ask her how exactly she fell she tells me she does not really know "I just fell". She is 89 years old and really does not want to do anything too aggressive as far as medical intervention is concerned but she does want to obviously get this wound healed. She has been on Doxy for 10 days that is complete at this point she is been using mupirocin topically up to this point. Fortunately I do not see any signs of active infection locally nor systemically which is great news. Patient does have a history of peripheral  vascular disease, hypertension, chronic kidney disease stage III. 10-26-2022 upon evaluation today patient appears to be doing better in regard to her wound. This is actually showing signs of improvement and I am actually very pleased with where things stand currently. I do not see any signs of active infection locally nor systemically which is great news. No fevers, chills, nausea, vomiting, or diarrhea. 11-06-2022 upon evaluation today patient appears to be doing well currently in regard to her wound which is actually completely healed this is great news. Fortunately I do not see any signs of active infection locally or systemically at this time. Electronic Signature(s) Signed: 11/06/2022 11:15:50 AM By: Worthy Keeler PA-C Entered By: Worthy Keeler on 11/06/2022 11:15:50 -------------------------------------------------------------------------------- Physical Exam Details Patient Name: Date of Service: Julie Agent, DO RIS 11/06/2022 10:15 A M Medical Record Number: [id number] Patient Account Number: [id number] Date of Birth/Sex: Treating RN: 16-Nov-1922 (89 y.o. Julie Potts Primary Care Provider: Dereck Potts Other Clinician: Referring Provider: Treating Provider/Extender: Kathi Ludwig Weeks in Treatment: 4 Constitutional Well-nourished and well-hydrated in no acute distress. Respiratory Julie, Potts ([id number]) [id number] Page 2 of 5 normal breathing without difficulty. Psychiatric this patient is able to make decisions and demonstrates good insight into disease process. Alert and Oriented x 3. pleasant and cooperative. Notes Upon inspection patient's wound bed actually showed signs of good granulation epithelization at this point. Fortunately I do not see any signs of anything open and appears to be completely healed I am very pleased with where we stand currently. Electronic Signature(s) Signed: 11/06/2022 11:16:15 AM By: Worthy Keeler  PA-C Entered By: Worthy Keeler on 11/06/2022 11:16:15 -------------------------------------------------------------------------------- Physician Orders Details Patient Name: Date of Service: Julie [name], DO RIS 11/06/2022 10:15 A M Medical Record Number: [id number] Patient Account Number: [id number] Date of  Birth/Sex: Treating RN: 1922-08-14 (89 y.o. Julie Potts Primary Care Provider: Damaris Schooner Other Clinician: Referring Provider: Treating Provider/Extender: Reina Fuse Weeks in Treatment: 4 Verbal / Phone Orders: No Diagnosis Coding ICD-10 Coding Code Description I73.89 Other specified peripheral vascular diseases L97.812 Non-pressure chronic ulcer of other part of right lower leg with fat layer exposed I10 Essential (primary) hypertension N18.30 Chronic kidney disease, stage 3 unspecified Discharge From [facility] Discharge from [facility] Treatment Complete - Apply A and D ointment then apply Tubi Grip for 2 weeks then go back to compression stocking Electronic Signature(s) Signed: 11/06/2022 4:29:20 PM By: Midge Aver MSN RN CNS WTA Signed: 11/09/2022 8:47:01 AM By: Lenda Kelp PA-C Entered By: Midge Aver on 11/06/2022 11:14:04 -------------------------------------------------------------------------------- Problem List Details Patient Name: Date of Service: Julie Pares, DO RIS 11/06/2022 10:15 A M Medical Record Number: [id number] Patient Account Number: [id number] Date of Birth/Sex: Treating RN: 1923/06/27 (89 y.o. Julie Potts Primary Care Provider: Damaris Schooner Other Clinician: Referring Provider: Treating Provider/Extender: Reina Fuse Weeks in Treatment: 4 Active Problems ICD-10 Encounter Code Description Active Date MDM Diagnosis I73.89 Other specified peripheral vascular diseases 10/05/2022 No Yes L97.812 Non-pressure chronic ulcer of other part of right lower leg with fat layer 10/05/2022 No Yes exposed I10  Essential (primary) hypertension 10/05/2022 No Yes Julie Potts ([id number]) [id number] Page 3 of 5 N18.30 Chronic kidney disease, stage 3 unspecified 10/05/2022 No Yes Inactive Problems Resolved Problems Electronic Signature(s) Signed: 11/06/2022 11:09:31 AM By: Lenda Kelp PA-C Entered By: Lenda Kelp on 11/06/2022 11:09:31 -------------------------------------------------------------------------------- Progress Note Details Patient Name: Date of Service: Julie Linford Arnold, DO RIS 11/06/2022 10:15 A M Medical Record Number: [id number] Patient Account Number: [id number] Date of Birth/Sex: Treating RN: 08/20/1922 (89 y.o. Julie Potts Primary Care Provider: Damaris Schooner Other Clinician: Referring Provider: Treating Provider/Extender: Reina Fuse Weeks in Treatment: 4 Subjective Chief Complaint Information obtained from Patient Right LE Ulcer History of Present Illness (HPI) 10-05-2022 patient presents today for initial evaluation here at clinic concerning a wound over the right anterior lower leg that has been present since September 03, 2022. The patient tells me that she fell when I ask her how exactly she fell she tells me she does not really know "I just fell". She is 89 years old and really does not want to do anything too aggressive as far as medical intervention is concerned but she does want to obviously get this wound healed. She has been on Doxy for 10 days that is complete at this point she is been using mupirocin topically up to this point. Fortunately I do not see any signs of active infection locally nor systemically which is great news. Patient does have a history of peripheral vascular disease, hypertension, chronic kidney disease stage III. 10-26-2022 upon evaluation today patient appears to be doing better in regard to her wound. This is actually showing signs of improvement and I am actually very pleased with where things stand  currently. I do not see any signs of active infection locally nor systemically which is great news. No fevers, chills, nausea, vomiting, or diarrhea. 11-06-2022 upon evaluation today patient appears to be doing well currently in regard to her wound which is actually completely healed this is great news. Fortunately I do not see any signs of active infection locally or systemically at this time. Objective Constitutional Well-nourished and well-hydrated in no acute distress. Vitals Time Taken: 10:25 AM, Height: 63 in, Weight: 145 lbs,  BMI: 25.7, Temperature: 97.4 F, Pulse: 54 bpm, Respiratory Rate: 18 breaths/min, Blood Pressure: 121/47 mmHg. Respiratory normal breathing without difficulty. Psychiatric this patient is able to make decisions and demonstrates good insight into disease process. Alert and Oriented x 3. pleasant and cooperative. General Notes: Upon inspection patient's wound bed actually showed signs of good granulation epithelization at this point. Fortunately I do not see any signs of anything open and appears to be completely healed I am very pleased with where we stand currently. Integumentary (Hair, Skin) Wound #1 status is Healed - Epithelialized. Original cause of wound was Gradually Appeared. The date acquired was: 09/03/2022. The wound has been in treatment 4 weeks. The wound is located on the Right,Anterior Lower Leg. The wound measures 0cm length x 0cm width x 0cm depth; 0cm^2 area and 0cm^3 volume. There is a medium amount of serosanguineous drainage noted. There is medium (34-66%) red, pink granulation within the wound bed. There is no necrotic tissue within the wound bed. Julie Potts, Julie Potts ([id number]) [id number] Page 4 of 5 Assessment Active Problems ICD-10 Other specified peripheral vascular diseases Non-pressure chronic ulcer of other part of right lower leg with fat layer exposed Essential (primary) hypertension Chronic kidney disease,  stage 3 unspecified Plan Discharge From [facility] Services: Discharge from Wound Care Center Treatment Complete - Apply A and D ointment then apply Tubi Grip for 2 weeks then go back to compression stocking 1. I would recommend currently that we have the patient going continue to monitor for any signs of infection or worsening. Based on what I am seeing I do believe we are headed in the right direction here. 2. I am also recommend that the patient continue with some AandD ointment to the wound followed by the Tubigrip she should do this for 2 weeks. After that time she can switch over to her own compression sock and will no longer need the AandD ointment. She is in agreement with the plan. Will see her back for follow-up visit as needed. Electronic Signature(s) Signed: 11/06/2022 11:16:56 AM By: Lenda Kelp PA-C Entered By: Lenda Kelp on 11/06/2022 11:16:56 -------------------------------------------------------------------------------- SuperBill Details Patient Name: Date of Service: Julie Pares, DO RIS 11/06/2022 Medical Record Number: [id number] Patient Account Number: [id number] Date of Birth/Sex: Treating RN: January 06, 1923 (89 y.o. Julie Potts Primary Care Provider: Damaris Schooner Other Clinician: Referring Provider: Treating Provider/Extender: Reina Fuse Weeks in Treatment: 4 Diagnosis Coding ICD-10 Codes Code Description I73.89 Other specified peripheral vascular diseases L97.812 Non-pressure chronic ulcer of other part of right lower leg with fat layer exposed I10 Essential (primary) hypertension N18.30 Chronic kidney disease, stage 3 unspecified Facility Procedures : CPT4 Code: [id number] Description: 30865 - WOUND CARE VISIT-LEV 3 EST PT Modifier: Quantity: 1 Physician Procedures Electronic Signature(s) Signed: 11/06/2022 11:18:35 AM By: Lenda Kelp PA-C Entered By: Lenda Kelp on 11/06/2022 11:18:35

## 2022-11-06 NOTE — Progress Notes (Signed)
Julie Potts, Julie Potts ([id number]) [id number] Page 1 of 7 Visit Report for 11/06/2022 Arrival Information Details Patient Name: Date of Service: Julie Potts 11/06/2022 10:15 A M Medical Record Number: [id number] Patient Account Number: [id number] Date of Birth/Sex: Treating RN: [birth year] (89 y.o. Julie Potts Primary Care Ilai Hiller: Dereck Ligas Other Clinician: Referring Graysen Woodyard: Treating Jhada Risk/Extender: Kathi Ludwig Weeks in Treatment: 4 Visit Information History Since Last Visit Added or deleted any medications: No Patient Arrived: Walker Has Dressing in Place as Prescribed: Yes Arrival Time: 10:16 Pain Present Now: No Accompanied By: daughter Transfer Assistance: None Patient Identification Verified: Yes Secondary Verification Process Completed: Yes Patient Requires Transmission-Based Precautions: No Patient Has Alerts: Yes Patient Alerts: Not Diabetic 10/05/22 R L ABI PAD Julie Potts Electronic Signature(s) Signed: 11/06/2022 4:29:20 PM By: Rosalio Loud MSN RN CNS WTA Entered By: Rosalio Loud on 11/06/2022 10:25:34 -------------------------------------------------------------------------------- Clinic Level of Care Assessment Details Patient Name: Date of Service: [name], [name] 11/06/2022 10:15 A M Medical Record Number: [id number] Patient Account Number: [id number] Date of Birth/Sex: Treating RN: [birth year] (89 y.o. Julie Potts Primary Care Azya Barbero: Dereck Ligas Other Clinician: Referring Zaivion Kundrat: Treating Cleburn Maiolo/Extender: Kathi Ludwig Weeks in Treatment: 4 Clinic Level of Care Assessment Items TOOL 4 Quantity Score X- 1 0 Use when only an EandM is performed on FOLLOW-UP visit ASSESSMENTS - Nursing Assessment / Reassessment X- 1 10 Reassessment of Co-morbidities (includes updates in patient status) X- 1 5 Reassessment of Adherence to Treatment Plan ASSESSMENTS - Wound and Skin A ssessment / Reassessment X -  Simple Wound Assessment / Reassessment - one wound 1 5 []  - 0 Complex Wound Assessment / Reassessment - multiple wounds []  - 0 Dermatologic / Skin Assessment (not related to wound area) ASSESSMENTS - Focused Assessment []  - 0 Circumferential Edema Measurements - multi extremities []  - 0 Nutritional Assessment / Counseling / Intervention []  - 0 Lower Extremity Assessment (monofilament, tuning fork, pulses) []  - 0 Peripheral Arterial Disease Assessment (using hand held doppler) ASSESSMENTS - Ostomy and/or Continence Assessment and Care []  - 0 Incontinence Assessment and Management []  - 0 Ostomy Care Assessment and Management (repouching, etc.) PROCESS - Coordination of Care X - Simple Patient / Family Education for ongoing care 1 15 []  - 0 Complex (extensive) Patient / Family Education for ongoing care Julie Potts, Julie Potts ([id number]) [phone].pdf Page 2 of 7 X- 1 10 Staff obtains Consents, Records, T Results / Process Orders est []  - 0 Staff telephones HHA, Nursing Homes / Clarify orders / etc []  - 0 Routine Transfer to another Facility (non-emergent condition) []  - 0 Routine Hospital Admission (non-emergent condition) []  - 0 New Admissions / Biomedical engineer / Ordering NPWT Apligraf, etc. , []  - 0 Emergency Hospital Admission (emergent condition) X- 1 10 Simple Discharge Coordination []  - 0 Complex (extensive) Discharge Coordination PROCESS - Special Needs []  - 0 Pediatric / Minor Patient Management []  - 0 Isolation Patient Management []  - 0 Hearing / Language / Visual special needs []  - 0 Assessment of Community assistance (transportation, D/C planning, etc.) []  - 0 Additional assistance / Altered mentation []  - 0 Support Surface(s) Assessment (bed, cushion, seat, etc.) INTERVENTIONS - Wound Cleansing / Measurement X - Simple Wound Cleansing - one wound 1 5 []  - 0 Complex Wound Cleansing - multiple wounds X- 1 5 Wound Imaging  (photographs - any number of wounds) []  - 0 Wound Tracing (instead of photographs) X- 1 5 Simple Wound Measurement - one wound []  - 0 Complex  Wound Measurement - multiple wounds INTERVENTIONS - Wound Dressings X - Small Wound Dressing one or multiple wounds 1 10 []  - 0 Medium Wound Dressing one or multiple wounds []  - 0 Large Wound Dressing one or multiple wounds []  - 0 Application of Medications - topical []  - 0 Application of Medications - injection INTERVENTIONS - Miscellaneous []  - 0 External ear exam []  - 0 Specimen Collection (cultures, biopsies, blood, body fluids, etc.) []  - 0 Specimen(s) / Culture(s) sent or taken to Lab for analysis []  - 0 Patient Transfer (multiple staff / Harrel Lemon Lift / Similar devices) []  - 0 Simple Staple / Suture removal (25 or less) []  - 0 Complex Staple / Suture removal (26 or more) []  - 0 Hypo / Hyperglycemic Management (close monitor of Blood Glucose) []  - 0 Ankle / Brachial Index (ABI) - do not check if billed separately X- 1 5 Vital Signs Has the patient been seen at the hospital within the last three years: Yes Total Score: 85 Level Of Care: New/Established - Level 3 Electronic Signature(s) Signed: 11/06/2022 4:29:20 PM By: Rosalio Loud MSN RN CNS WTA Entered By: Rosalio Loud on 11/06/2022 11:14:41 Julie Potts, Julie Potts ([id number] Page 3 of 7 -------------------------------------------------------------------------------- Encounter Discharge Information Details Patient Name: Date of Service: Julie Potts 11/06/2022 10:15 A M Medical Record Number: [id number] Patient Account Number: [id number] Date of Birth/Sex: Treating RN: [birth year] (89 y.o. Julie Potts Primary Care Kearstyn Avitia: Dereck Ligas Other Clinician: Referring Rada Zegers: Treating Taiz Bickle/Extender: Kathi Ludwig Weeks in Treatment: 4 Encounter Discharge Information Items Discharge Condition: Stable Ambulatory Status:  Ambulatory Discharge Destination: Home Transportation: Private Auto Accompanied By: daughter Schedule Follow-up Appointment: No Clinical Summary of Care: Electronic Signature(s) Signed: 11/06/2022 4:29:20 PM By: Rosalio Loud MSN RN CNS WTA Entered By: Rosalio Loud on 11/06/2022 11:16:01 -------------------------------------------------------------------------------- Lower Extremity Assessment Details Patient Name: Date of Service: Julie Agent, [name] 11/06/2022 10:15 A M Medical Record Number: [id number] Patient Account Number: [id number] Date of Birth/Sex: Treating RN: 1923/01/27 (89 y.o. Julie Potts Primary Care Bayler Gehrig: Dereck Ligas Other Clinician: Referring Sevyn Markham: Treating Kimberlie Csaszar/Extender: Kathi Ludwig Weeks in Treatment: 4 Edema Assessment Assessed: [Left: No] [Right: No] [Left: Edema] [Right: :] Calf Left: Right: Point of Measurement: 32 cm From Medial Instep 34 cm Ankle Left: Right: Point of Measurement: 12 cm From Medial Instep 18.2 cm Knee To Floor Left: Right: From Medial Instep 40 cm Vascular Assessment Pulses: Dorsalis Pedis Palpable: [Right:Yes] Electronic Signature(s) Signed: 11/06/2022 4:29:20 PM By: Rosalio Loud MSN RN CNS WTA Entered By: Rosalio Loud on 11/06/2022 10:34:07 -------------------------------------------------------------------------------- Multi Wound Chart Details Patient Name: Date of Service: Julie Agent, [name] 11/06/2022 10:15 A M Medical Record Number: [id number] Patient Account Number: [id number] Date of Birth/Sex: Treating RN: March 07, 1923 (89 y.o. Julie Potts [place], [place] ([id number]) [id number] Page 4 of 7 Primary Care Willo Yoon: Dereck Ligas Other Clinician: Referring Ethaniel Garfield: Treating Lamoine Magallon/Extender: Kathi Ludwig Weeks in Treatment: 4 Vital Signs Height(in): 63 Pulse(bpm): 4 Weight(lbs): 145 Blood Pressure(mmHg): 121/47 Body Mass Index(BMI):  25.7 Temperature(F): 97.4 Respiratory Rate(breaths/min): 18 [1:Photos:] [N/A:N/A] Right, Anterior Lower Leg N/A N/A Wound Location: Gradually Appeared N/A N/A Wounding Event: Arterial Insufficiency Ulcer N/A N/A Primary Etiology: Asthma, Hypertension N/A N/A Comorbid History: 09/03/2022 N/A N/A Date Acquired: 4 N/A N/A Weeks of Treatment: Open N/A N/A Wound Status: No N/A N/A Wound Recurrence: 0.3x0.2x0.1 N/A N/A Measurements L x W x D (cm) 0.047 N/A N/A A (cm) : rea 0.005 N/A N/A Volume (cm) :  94.00% N/A N/A % Reduction in A rea: 93.70% N/A N/A % Reduction in Volume: Full Thickness Without Exposed N/A N/A Classification: Support Structures Medium N/A N/A Exudate Amount: Serosanguineous N/A N/A Exudate Type: red, brown N/A N/A Exudate Color: Medium (34-66%) N/A N/A Granulation Amount: Red, Pink N/A N/A Granulation Quality: None Present (0%) N/A N/A Necrotic Amount: Fascia: No N/A N/A Exposed Structures: Fat Layer (Subcutaneous Tissue): No Tendon: No Muscle: No Joint: No Bone: No Small (1-33%) N/A N/A Epithelialization: Treatment Notes Electronic Signature(s) Signed: 11/06/2022 4:29:20 PM By: Rosalio Loud MSN RN CNS WTA Entered By: Rosalio Loud on 11/06/2022 11:11:24 -------------------------------------------------------------------------------- Multi-Disciplinary Care Plan Details Patient Name: Date of Service: Julie Agent, [name] 11/06/2022 10:15 A M Medical Record Number: [id number] Patient Account Number: [id number] Date of Birth/Sex: Treating RN: [birth year] (89 y.o. Julie Potts Primary Care Draken Farrior: Dereck Ligas Other Clinician: Referring Dru Primeau: Treating Feliz Herard/Extender: Kathi Ludwig Weeks in Treatment: 4 Active Inactive Electronic Signature(s) Signed: 11/06/2022 4:29:20 PM By: Rosalio Loud MSN RN CNS [address], [place] ([id number]) PM By: Rosalio Loud MSN RN CNS Lissa Morales [id number].pdf Page 5 of  7 Signed: 11/06/2022 4:29:20 Entered By: Rosalio Loud on 11/06/2022 11:15:09 -------------------------------------------------------------------------------- Pain Assessment Details Patient Name: Date of Service: Julie Potts 11/06/2022 10:15 A M Medical Record Number: [id number] Patient Account Number: [id number] Date of Birth/Sex: Treating RN: [birth year] (89 y.o. Julie Potts Primary Care Mykela Mewborn: Dereck Ligas Other Clinician: Referring Abdallah Hern: Treating Emilynn Srinivasan/Extender: Kathi Ludwig Weeks in Treatment: 4 Active Problems Location of Pain Severity and Description of Pain Patient Has Paino No Site Locations Pain Management and Medication Current Pain Management: Electronic Signature(s) Signed: 11/06/2022 4:29:20 PM By: Rosalio Loud MSN RN CNS WTA Entered By: Rosalio Loud on 11/06/2022 10:28:16 -------------------------------------------------------------------------------- Patient/Caregiver Education Details Patient Name: Date of Service: Julie Agent, [name] 4/2/2024andnbsp10:15 [place] Record Number: [id number] Patient Account Number: [id number] Date of Birth/Gender: Treating RN: October 26, 1922 (89 y.o. Julie Potts Primary Care Physician: Dereck Ligas Other Clinician: Referring Physician: Treating Physician/Extender: Jesse Fall in Treatment: 4 Education Assessment Education Provided To: Patient Education Topics Provided Electronic Signature(s) Signed: 11/06/2022 4:29:20 PM By: Rosalio Loud MSN RN CNS WTA Entered By: Rosalio Loud on 11/06/2022 11:15:15 [name] ([id number] Page 6 of 7 -------------------------------------------------------------------------------- Wound Assessment Details Patient Name: Date of Service: Julie Potts 11/06/2022 10:15 A M Medical Record Number: [id number] Patient Account Number: [id number] Date of Birth/Sex: Treating RN: May 27, 1923 (89 y.o. Julie Potts Primary Care Oscar Hank: Dereck Ligas Other Clinician: Referring Tony Friscia: Treating Donnovan Stamour/Extender: Kathi Ludwig Weeks in Treatment: 4 Wound Status Wound Number: 1 Primary Etiology: Arterial Insufficiency Ulcer Wound Location: Right, Anterior Lower Leg Wound Status: Healed - Epithelialized Wounding Event: Gradually Appeared Comorbid History: Asthma, Hypertension Date Acquired: 09/03/2022 Weeks Of Treatment: 4 Clustered Wound: No Wound Measurements Length: (cm) Width: (cm) Depth: (cm) Area: (cm) Volume: (cm) 0 % Reduction in Area: 100% 0 % Reduction in Volume: 100% 0 Epithelialization: Small (1-33%) 0 0 Wound Description Classification: Full Thickness Without Exposed Support Exudate Amount: Medium Exudate Type: Serosanguineous Exudate Color: red, brown Structures Foul Odor After Cleansing: No Slough/Fibrino No Wound Bed Granulation Amount: Medium (34-66%) Exposed Structure Granulation Quality: Red, Pink Fascia Exposed: No Necrotic Amount: None Present (0%) Fat Layer (Subcutaneous Tissue) Exposed: No Tendon Exposed: No Muscle Exposed: No Joint Exposed: No Bone Exposed: No Treatment Notes Wound #1 (Lower Leg) Wound Laterality: Right, Anterior Cleanser Peri-Wound Care Topical Primary Dressing Secondary Dressing Secured  With Compression Wrap Compression Stockings Add-Ons Electronic Signature(s) Signed: 11/06/2022 4:29:20 PM By: Rosalio Loud MSN RN CNS WTA Entered By: Rosalio Loud on 11/06/2022 11:13:01 -------------------------------------------------------------------------------- Vitals Details Patient Name: Date of Service: Julie Agent, [name] 11/06/2022 10:15 A M Medical Record Number: [id number] Patient Account Number: [id number] Date of Birth/Sex: Treating RN: Feb 27, 1923 (89 y.o. Julie Potts Primary Care Nichelle Renwick: Dereck Ligas Other Clinician: Referring Omesha Bowerman: Treating Loucille Takach/Extender: Kathi Ludwig Weeks in  Treatment: [place], [place] ([id number]) [id number] Page 7 of 7 Vital Signs Time Taken: 10:25 Temperature (F): 97.4 Height (in): 63 Pulse (bpm): 54 Weight (lbs): 145 Respiratory Rate (breaths/min): 18 Body Mass Index (BMI): 25.7 Blood Pressure (mmHg): 121/47 Reference Range: 80 - 120 mg / dl Electronic Signature(s) Signed: 11/06/2022 4:29:20 PM By: Rosalio Loud MSN RN CNS WTA Entered By: Rosalio Loud on 11/06/2022 10:28:07

## 2023-04-01 ENCOUNTER — Other Ambulatory Visit: Payer: Self-pay

## 2023-04-01 ENCOUNTER — Emergency Department: Payer: Medicare PPO

## 2023-04-01 ENCOUNTER — Inpatient Hospital Stay
Admission: EM | Admit: 2023-04-01 | Discharge: 2023-04-05 | DRG: 682 | Disposition: A | Discharge status: Home Health | Payer: Medicare PPO | Attending: Student | Admitting: Student

## 2023-04-01 DIAGNOSIS — I5032 Chronic diastolic (congestive) heart failure: Secondary | ICD-10-CM | POA: Diagnosis present

## 2023-04-01 DIAGNOSIS — I5033 Acute on chronic diastolic (congestive) heart failure: Secondary | ICD-10-CM

## 2023-04-01 DIAGNOSIS — D631 Anemia in chronic kidney disease: Secondary | ICD-10-CM | POA: Diagnosis present

## 2023-04-01 DIAGNOSIS — E871 Hypo-osmolality and hyponatremia: Secondary | ICD-10-CM | POA: Diagnosis not present

## 2023-04-01 DIAGNOSIS — N390 Urinary tract infection, site not specified: Secondary | ICD-10-CM | POA: Diagnosis present

## 2023-04-01 DIAGNOSIS — W19XXXA Unspecified fall, initial encounter: Secondary | ICD-10-CM

## 2023-04-01 DIAGNOSIS — Z882 Allergy status to sulfonamides status: Secondary | ICD-10-CM

## 2023-04-01 DIAGNOSIS — N179 Acute kidney failure, unspecified: Secondary | ICD-10-CM

## 2023-04-01 DIAGNOSIS — Z96649 Presence of unspecified artificial hip joint: Secondary | ICD-10-CM | POA: Diagnosis present

## 2023-04-01 DIAGNOSIS — Z79899 Other long term (current) drug therapy: Secondary | ICD-10-CM

## 2023-04-01 DIAGNOSIS — I10 Essential (primary) hypertension: Secondary | ICD-10-CM | POA: Diagnosis present

## 2023-04-01 DIAGNOSIS — Z5320 Procedure and treatment not carried out because of patient's decision for unspecified reasons: Secondary | ICD-10-CM | POA: Diagnosis present

## 2023-04-01 DIAGNOSIS — J449 Chronic obstructive pulmonary disease, unspecified: Secondary | ICD-10-CM | POA: Diagnosis present

## 2023-04-01 DIAGNOSIS — E890 Postprocedural hypothyroidism: Secondary | ICD-10-CM | POA: Diagnosis present

## 2023-04-01 DIAGNOSIS — I5030 Unspecified diastolic (congestive) heart failure: Secondary | ICD-10-CM | POA: Diagnosis present

## 2023-04-01 DIAGNOSIS — N170 Acute kidney failure with tubular necrosis: Principal | ICD-10-CM | POA: Diagnosis present

## 2023-04-01 DIAGNOSIS — E639 Nutritional deficiency, unspecified: Secondary | ICD-10-CM | POA: Diagnosis present

## 2023-04-01 DIAGNOSIS — R531 Weakness: Secondary | ICD-10-CM | POA: Diagnosis not present

## 2023-04-01 DIAGNOSIS — Z66 Do not resuscitate: Secondary | ICD-10-CM | POA: Diagnosis present

## 2023-04-01 DIAGNOSIS — Z881 Allergy status to other antibiotic agents status: Secondary | ICD-10-CM

## 2023-04-01 DIAGNOSIS — I130 Hypertensive heart and chronic kidney disease with heart failure and stage 1 through stage 4 chronic kidney disease, or unspecified chronic kidney disease: Secondary | ICD-10-CM | POA: Diagnosis present

## 2023-04-01 DIAGNOSIS — Z87440 Personal history of urinary (tract) infections: Secondary | ICD-10-CM

## 2023-04-01 DIAGNOSIS — J9601 Acute respiratory failure with hypoxia: Secondary | ICD-10-CM | POA: Diagnosis not present

## 2023-04-01 DIAGNOSIS — E872 Acidosis, unspecified: Secondary | ICD-10-CM | POA: Diagnosis present

## 2023-04-01 DIAGNOSIS — N189 Chronic kidney disease, unspecified: Secondary | ICD-10-CM

## 2023-04-01 DIAGNOSIS — N184 Chronic kidney disease, stage 4 (severe): Secondary | ICD-10-CM | POA: Diagnosis present

## 2023-04-01 DIAGNOSIS — Z79890 Hormone replacement therapy (postmenopausal): Secondary | ICD-10-CM

## 2023-04-01 DIAGNOSIS — D638 Anemia in other chronic diseases classified elsewhere: Secondary | ICD-10-CM | POA: Insufficient documentation

## 2023-04-01 HISTORY — DX: Unspecified diastolic (congestive) heart failure: I50.30

## 2023-04-01 HISTORY — DX: Chronic kidney disease, stage 4 (severe): N18.4

## 2023-04-01 HISTORY — DX: Anemia in other chronic diseases classified elsewhere: D63.8

## 2023-04-01 HISTORY — DX: Urinary tract infection, site not specified: N39.0

## 2023-04-01 HISTORY — DX: Hypothyroidism, unspecified: E03.9

## 2023-04-01 LAB — BASIC METABOLIC PANEL
Anion gap: 10 (ref 5–15)
BUN: 54 mg/dL — ABNORMAL HIGH (ref 8–23)
CO2: 19 mmol/L — ABNORMAL LOW (ref 22–32)
Calcium: 8.9 mg/dL (ref 8.9–10.3)
Chloride: 99 mmol/L (ref 98–111)
Creatinine, Ser: 2.67 mg/dL — ABNORMAL HIGH (ref 0.44–1.00)
GFR, Estimated: 16 mL/min — ABNORMAL LOW (ref >60)
Glucose, Bld: 85 mg/dL (ref 70–99)
Potassium: 4.7 mmol/L (ref 3.5–5.1)
Sodium: 128 mmol/L — ABNORMAL LOW (ref 135–145)

## 2023-04-01 LAB — URINALYSIS, ROUTINE W REFLEX MICROSCOPIC
Bilirubin Urine: NEGATIVE
Glucose, UA: NEGATIVE mg/dL
Hgb urine dipstick: NEGATIVE
Ketones, ur: NEGATIVE mg/dL
Nitrite: NEGATIVE
Protein, ur: NEGATIVE mg/dL
Specific Gravity, Urine: 1.005 (ref 1.005–1.030)
pH: 5 (ref 5.0–8.0)

## 2023-04-01 LAB — CBC
HCT: 28.7 % — ABNORMAL LOW (ref 36.0–46.0)
Hemoglobin: 9.2 g/dL — ABNORMAL LOW (ref 12.0–15.0)
MCH: 28.9 pg (ref 26.0–34.0)
MCHC: 32.1 g/dL (ref 30.0–36.0)
MCV: 90.3 fL (ref 80.0–100.0)
Platelets: 231 10*3/uL (ref 150–400)
RBC: 3.18 MIL/uL — ABNORMAL LOW (ref 3.87–5.11)
RDW: 14.8 % (ref 11.5–15.5)
WBC: 6.5 10*3/uL (ref 4.0–10.5)
nRBC: 0 % (ref 0.0–0.2)

## 2023-04-01 LAB — BRAIN NATRIURETIC PEPTIDE: B Natriuretic Peptide: 566.3 pg/mL — ABNORMAL HIGH (ref 0.0–100.0)

## 2023-04-01 MED ORDER — ENOXAPARIN SODIUM 30 MG/0.3ML IJ SOSY
30.0000 mg | PREFILLED_SYRINGE | INTRAMUSCULAR | Status: DC
  Administered 2023-04-01 – 2023-04-04 (×4): 30 mg via SUBCUTANEOUS
  Filled 2023-04-01 (×4): qty 0.3

## 2023-04-01 MED ORDER — METOPROLOL SUCCINATE ER 50 MG PO TB24
50.0000 mg | ORAL_TABLET | Freq: Every day | ORAL | Status: DC
  Administered 2023-04-01: 50 mg via ORAL
  Filled 2023-04-01: qty 1

## 2023-04-01 MED ORDER — GABAPENTIN 100 MG PO CAPS
200.0000 mg | ORAL_CAPSULE | Freq: Every day | ORAL | Status: DC
  Administered 2023-04-02 – 2023-04-05 (×4): 200 mg via ORAL
  Filled 2023-04-01 (×4): qty 2

## 2023-04-01 MED ORDER — ONDANSETRON HCL 4 MG/2ML IJ SOLN: 4.0000 mg | Freq: Four times a day (QID) | INTRAMUSCULAR | Status: DC | PRN

## 2023-04-01 MED ORDER — ACETAMINOPHEN 650 MG RE SUPP: 650.0000 mg | Freq: Four times a day (QID) | RECTAL | Status: DC | PRN

## 2023-04-01 MED ORDER — LEVOTHYROXINE SODIUM 50 MCG PO TABS
50.0000 ug | ORAL_TABLET | Freq: Every day | ORAL | Status: DC
  Administered 2023-04-02 – 2023-04-05 (×4): 50 ug via ORAL
  Filled 2023-04-01 (×4): qty 1

## 2023-04-01 MED ORDER — MONTELUKAST SODIUM 10 MG PO TABS
10.0000 mg | ORAL_TABLET | Freq: Every day | ORAL | Status: DC
  Administered 2023-04-01 – 2023-04-04 (×4): 10 mg via ORAL
  Filled 2023-04-01 (×4): qty 1

## 2023-04-01 MED ORDER — SODIUM CHLORIDE 0.9 % IV BOLUS
500.0000 mL | Freq: Once | INTRAVENOUS | Status: AC
  Administered 2023-04-01: 500 mL via INTRAVENOUS

## 2023-04-01 MED ORDER — BUDESONIDE 0.25 MG/2ML IN SUSP
0.2500 mg | Freq: Two times a day (BID) | RESPIRATORY_TRACT | Status: DC
  Administered 2023-04-02 – 2023-04-05 (×7): 0.25 mg via RESPIRATORY_TRACT
  Filled 2023-04-01 (×5): qty 2

## 2023-04-01 MED ORDER — ACETAMINOPHEN 325 MG PO TABS
650.0000 mg | ORAL_TABLET | Freq: Four times a day (QID) | ORAL | Status: DC | PRN
  Administered 2023-04-03 – 2023-04-05 (×7): 650 mg via ORAL
  Filled 2023-04-01 (×7): qty 2

## 2023-04-01 MED ORDER — HYDROCODONE-ACETAMINOPHEN 5-325 MG PO TABS
1.0000 | ORAL_TABLET | Freq: Two times a day (BID) | ORAL | Status: DC | PRN
  Administered 2023-04-05: 1 via ORAL
  Filled 2023-04-01: qty 1

## 2023-04-01 MED ORDER — AMLODIPINE BESYLATE 5 MG PO TABS
5.0000 mg | ORAL_TABLET | Freq: Every day | ORAL | Status: DC
  Administered 2023-04-01 – 2023-04-05 (×4): 5 mg via ORAL
  Filled 2023-04-01 (×5): qty 1

## 2023-04-01 MED ORDER — ALBUTEROL SULFATE (2.5 MG/3ML) 0.083% IN NEBU: 2.5000 mg | INHALATION_SOLUTION | Freq: Four times a day (QID) | RESPIRATORY_TRACT | Status: DC | PRN

## 2023-04-01 MED ORDER — ONDANSETRON HCL 4 MG PO TABS: 4.0000 mg | ORAL_TABLET | Freq: Four times a day (QID) | ORAL | Status: DC | PRN

## 2023-04-01 MED ORDER — FLUTICASONE PROPIONATE DISKUS 250 MCG/ACT IN AEPB: 1.0000 | INHALATION_SPRAY | Freq: Every day | RESPIRATORY_TRACT | Status: DC

## 2023-04-01 MED ORDER — SODIUM CHLORIDE 0.9% FLUSH
3.0000 mL | Freq: Two times a day (BID) | INTRAVENOUS | Status: DC
  Administered 2023-04-01 – 2023-04-05 (×8): 3 mL via INTRAVENOUS

## 2023-04-01 MED ORDER — POLYETHYLENE GLYCOL 3350 17 G PO PACK
17.0000 g | PACK | Freq: Every day | ORAL | Status: DC | PRN
  Administered 2023-04-02: 17 g via ORAL
  Filled 2023-04-01: qty 1

## 2023-04-01 MED ORDER — HYDRALAZINE HCL 50 MG PO TABS
50.0000 mg | ORAL_TABLET | Freq: Three times a day (TID) | ORAL | Status: DC
  Administered 2023-04-01 – 2023-04-05 (×12): 50 mg via ORAL
  Filled 2023-04-01 (×13): qty 1

## 2023-04-01 NOTE — Assessment & Plan Note (Signed)
Patient is presenting with increased generalized weakness with a ground-level fall with no injuries at this time.  Weakness is likely multifactorial in the setting of AKI and hyponatremia.  No evidence of hypervolemia on exam to suggest CHF involvement.  Vital signs are stable  - Management of AKI and hyponatremia as noted below - PT/OT

## 2023-04-01 NOTE — Plan of Care (Signed)

## 2023-04-01 NOTE — Assessment & Plan Note (Addendum)
History of CKD stage IV with baseline creatinine between 1.3 and 1.6, currently 2.67.  This is in the setting of recent initiation of Bactrim, likely causing nephrotoxicity.  She appears euvolemic on examination and diuretics have not been recently changed.  - Avoid home nephrotoxic agents - Discontinue Bactrim - Gentle IV fluids - Strict in and out - Bladder scan - Repeat BMP in the a.m.

## 2023-04-01 NOTE — Assessment & Plan Note (Signed)
Although patient's BNP is elevated today at 566, she appears euvolemic on examination with no lower extremity swelling, no abdominal distention and no rales on examination.  - Hold home Lasix today given AKI - Resume once improvement in renal function - Daily weights - Strict in and out

## 2023-04-01 NOTE — Assessment & Plan Note (Signed)
Patient presenting with hyponatremia at 128.  No prior history.  Given recent Bactrim use and AKI, likely drug-related.  - Gentle IV fluids - Repeat BMP in the a.m.

## 2023-04-01 NOTE — ED Triage Notes (Signed)
Pt to ED [facility] from [facility] for fall, reports legs gave out. Recent fall on Saturday. Reports fluid retention the past few days.  Being treated for UTI Recently started amlodipine.  +shob with exertion. 92% on RA with ems, placed on 2 L  with ems.  Hx CHF  Reports hit left side of face, no loc. Redness noted around right eye.

## 2023-04-01 NOTE — ED Notes (Signed)
Pt refused to get up at this time, states she is unable. ERP notified no new orders

## 2023-04-01 NOTE — Assessment & Plan Note (Signed)
Hemoglobin has ranged between 9.4 and 11.5 over the last 6 months, currently 9.2.

## 2023-04-01 NOTE — ED Provider Notes (Signed)
[facility] Provider Note    Event Date/Time   First MD Initiated Contact with Patient 04/01/23 1018     (approximate)   History   Fall   HPI  Julie Potts is a 89 y.o. female presents to the ER from facility after she fell like her legs gave out and she fell striking her head.  Denies any chest pain or shortness of breath.  No hip pain no leg pain.  Denies any weakness no numbness or tingling.  Has felt some shortness of breath.  Of a history of heart failure recently being treated for UTI.  Does not wear home oxygen.     Physical Exam   Triage Vital Signs: ED Triage Vitals  Encounter Vitals Group     BP 04/01/23 1023 (!) 161/49     Systolic BP Percentile --      Diastolic BP Percentile --      Pulse Rate 04/01/23 1023 65     Resp 04/01/23 1023 20     Temp 04/01/23 1023 97.8 F (36.6 C)     Temp Source 04/01/23 1023 Oral     SpO2 04/01/23 1023 94 %     Weight 04/01/23 1021 143 lb (64.9 kg)     Height 04/01/23 1021 5\' 3"  (1.6 m)     Head Circumference --      Peak Flow --      Pain Score 04/01/23 1021 0     Pain Loc --      Pain Education --      Exclude from Growth Chart --     Most recent vital signs: Vitals:   04/01/23 1100 04/01/23 1130  BP: (!) 138/49 (!) 141/45  Pulse: 61 63  Resp: 14 17  Temp:    SpO2: 94% 96%     Constitutional: Alert  Eyes: Conjunctivae are normal.  Head: Atraumatic. Nose: No congestion/rhinnorhea. Mouth/Throat: Mucous membranes are moist.   Neck: Painless ROM.  Cardiovascular:   Good peripheral circulation. Respiratory: Normal respiratory effort.  No retractions.  Gastrointestinal: Soft and nontender.  Musculoskeletal:  no deformity Neurologic:  MAE spontaneously. No gross focal neurologic deficits are appreciated.  Skin:  Skin is warm, dry and intact. No rash noted. Psychiatric: Mood and affect are normal. Speech and behavior are normal.    ED Results / Procedures / Treatments   Labs (all  labs ordered are listed, but only abnormal results are displayed) Labs Reviewed  CBC - Abnormal; Notable for the following components:      Result Value   RBC 3.18 (*)    Hemoglobin 9.2 (*)    HCT 28.7 (*)    All other components within normal limits  BRAIN NATRIURETIC PEPTIDE - Abnormal; Notable for the following components:   B Natriuretic Peptide 566.3 (*)    All other components within normal limits  BASIC METABOLIC PANEL - Abnormal; Notable for the following components:   Sodium 128 (*)    CO2 19 (*)    BUN 54 (*)    Creatinine, Ser 2.67 (*)    GFR, Estimated 16 (*)    All other components within normal limits  URINALYSIS, ROUTINE W REFLEX MICROSCOPIC - Abnormal; Notable for the following components:   Color, Urine STRAW (*)    APPearance CLEAR (*)    Leukocytes,Ua TRACE (*)    Bacteria, UA FEW (*)    All other components within normal limits     EKG  ED ECG REPORT  I, Willy Eddy, the attending physician, personally viewed and interpreted this ECG.   Date: 04/01/2023  EKG Time: 10:26  Rate: 70  Rhythm: sinus  Axis: normal  Intervals: normal  ST&T Change: normal    RADIOLOGY Please see ED Course for my review and interpretation.  I personally reviewed all radiographic images ordered to evaluate for the above acute complaints and reviewed radiology reports and findings.  These findings were personally discussed with the patient.  Please see medical record for radiology report.    PROCEDURES:  Critical Care performed: No  Procedures   MEDICATIONS ORDERED IN ED: Medications  sodium chloride 0.9 % bolus 500 mL (0 mLs Intravenous Stopped 04/01/23 1321)     IMPRESSION / MDM / ASSESSMENT AND PLAN / ED COURSE  I reviewed the triage vital signs and the nursing notes.                              Differential diagnosis includes, but is not limited to, dehydration, electrolyte abn, anemia, sdh, iph, cva  Patient presenting to the ER for evaluation of  symptoms as described above.  Based on symptoms, risk factors and considered above differential, this presenting complaint could reflect a potentially life-threatening illness therefore the patient will be placed on continuous pulse oximetry and telemetry for monitoring.  Laboratory evaluation will be sent to evaluate for the above complaints.      Clinical Course as of 04/01/23 1351  Mon Apr 01, 2023  1211 Patient with pretty significant AKI.  She is not hypoxic may have been over diuresed.  Will give gentle IV hydration. [PR]  1244 Patient is unable to ambulate.  Given her AKI age risk factors with falling spell today will consult hospitalist for admission. [PR]    Clinical Course User Index [PR] Willy Eddy, MD     FINAL CLINICAL IMPRESSION(S) / ED DIAGNOSES   Final diagnoses:  AKI (acute kidney injury) (HCC)  Weakness     Rx / DC Orders   ED Discharge Orders     None        Note:  This document was prepared using Dragon voice recognition software and may include unintentional dictation errors.    Willy Eddy, MD 04/01/23 1351

## 2023-04-01 NOTE — Assessment & Plan Note (Addendum)
Patient states she has a history of recurrent UTI that was recently treated with Keflex, before her cardiologist changed her to Bactrim per culture data demonstrating Klebsiella with resistance to Keflex.  At this time, no indication for additional treatment given patient has completed 5 days.  Urinalysis continues to have trace leukocytes and few bacteria, but patient is not experiencing any urinary or abdominal symptoms.  - Discontinue Bactrim

## 2023-04-01 NOTE — Assessment & Plan Note (Signed)
-   Resume home antihypertensives, with the exception of Lasix and irbesartan given AKI

## 2023-04-01 NOTE — ED Notes (Signed)
Pt assisted to use bed pan, and brief changed. Family at bedside. Pt denies other needs at this time

## 2023-04-01 NOTE — Plan of Care (Signed)

## 2023-04-01 NOTE — H&P (Signed)
History and Physical    Patient: Julie Potts ZOX:[id number] DOB: January 27, 1923 DOA: 04/01/2023 DOS: the patient was seen and examined on 04/01/2023 PCP: Damaris Schooner, DO  Patient coming from: Home  Chief Complaint:  Chief Complaint  Patient presents with   Fall   HPI: Julie Potts is a 89 y.o. female with medical history significant of HFpEF, CKD stage IV, hypertension, hypothyroidism, recurrent UTI, who presents to the ED due to fall.  [name] states that earlier today, she felt that both her legs had given out below her and she fell down and hit the right side of her face.  She denies any loss of consciousness.  She feels that weakness was throughout, including both her arms.  Prior to the fall, she denies any dizziness, palpitations, chest pain.  She notes that lately, she has not had any lower extremity swelling and shortness of breath with exertion has been stable and unchanged.  She has not had any recent medication changes with the exception of starting Bactrim approximately 5 days ago.  She notes that she typically cannot tell when she has a UTI stating urinary urgency has been chronic and unchanged for many years now.  She denies any dysuria.  At this time, she denies any symptoms other than weakness.  ED Course:  On arrival to the ED, patient was hypertensive at 138/49 with heart rate of 62.  She was saturating at 94% on room air.  She was afebrile at 97.8. Initial workup notable for WBC of 6.5, hemoglobin 9.2, sodium of 128, potassium 4.7, bicarb 19, BUN 54, creatinine 2.67 with GFR of 16.  BNP elevated at 566.  Chest x-ray was obtained with stable cardiomegaly and mild vascular congestion.  CT of the head was obtained with no acute intracranial abnormality, CT C-spine with no acute traumatic injuries.  Patient started on IV fluids and [facility] contacted for admission.  Review of Systems: As mentioned in the history of present illness. All other systems reviewed and are negative.    Past Medical History:  Diagnosis Date   Hypertension    Past Surgical History:  Procedure Laterality Date   APPENDECTOMY     BACK SURGERY     CHOLECYSTECTOMY     knee repacement     THYROIDECTOMY     TONSILLECTOMY     TOTAL HIP ARTHROPLASTY     Social History:  reports that she has never smoked. She has never used smokeless tobacco. She reports that she does not drink alcohol. No history on file for drug use.  Allergies  Allergen Reactions   Ciprofloxacin Nausea Only   Levofloxacin Other (See Comments)    Weakness, "could not use legs"   Sulfa Antibiotics Rash    History reviewed. No pertinent family history.  Prior to Admission medications   Medication Sig Start Date End Date Taking? Authorizing Provider  guaiFENesin (ROBITUSSIN) 100 MG/5ML SOLN Take 5 mLs (100 mg total) by mouth every 4 (four) hours as needed for cough or to loosen phlegm. 12/10/15   Sharman Cheek, MD  ondansetron (ZOFRAN ODT) 4 MG disintegrating tablet Take 1 tablet (4 mg total) by mouth every 8 (eight) hours as needed for nausea or vomiting. 12/10/15   Sharman Cheek, MD    Physical Exam: Vitals:   04/01/23 1021 04/01/23 1023 04/01/23 1100 04/01/23 1130  BP:  (!) 161/49 (!) 138/49 (!) 141/45  Pulse:  65 61 63  Resp:  20 14 17   Temp:  97.8 F (36.6 C)  TempSrc:  Oral    SpO2:  94% 94% 96%  Weight: 64.9 kg     Height: 5\' 3"  (1.6 m)      Physical Exam Vitals and nursing note reviewed.  Constitutional:      General: She is not in acute distress.    Appearance: She is normal weight. She is not toxic-appearing.  HENT:     Head: Normocephalic.     Comments: Redness around the right eye    Mouth/Throat:     Mouth: Mucous membranes are moist.     Pharynx: Oropharynx is clear.  Eyes:     Pupils: Pupils are equal, round, and reactive to light.     Comments: Peri-ocular erythema of the right eye. Otherwise, conjunctiva is normal bilaterally. No vision changes  Cardiovascular:     Rate and  Rhythm: Normal rate and regular rhythm.     Heart sounds: Murmur heard.  Pulmonary:     Effort: Pulmonary effort is normal. No respiratory distress.     Breath sounds: Normal breath sounds. No wheezing, rhonchi or rales.  Abdominal:     General: Bowel sounds are normal. There is no distension.     Palpations: Abdomen is soft.     Tenderness: There is no abdominal tenderness. There is no right CVA tenderness, left CVA tenderness or guarding.  Musculoskeletal:     Right lower leg: No edema.     Left lower leg: No edema.  Skin:    General: Skin is warm and dry.  Neurological:     Mental Status: She is alert.  Psychiatric:        Mood and Affect: Mood normal.        Behavior: Behavior normal.    Data Reviewed: CBC with WBC 6.5, hemoglobin 9.2, MCV of 90.3, platelets of 231 BMP with sodium of 128, potassium 4.7, bicarb 19, glucose 85, BUN 54, creatinine 2.67, GFR 16 BNP of 566  EKG pending,  CT HEAD WO CONTRAST ( )  Result Date: 04/01/2023 CLINICAL DATA:  Trauma EXAM: CT HEAD WITHOUT CONTRAST CT CERVICAL SPINE WITHOUT CONTRAST TECHNIQUE: Multidetector CT imaging of the head and cervical spine was performed following the standard protocol without intravenous contrast. Multiplanar CT image reconstructions of the cervical spine were also generated. RADIATION DOSE REDUCTION: This exam was performed according to the departmental dose-optimization program which includes automated exposure control, adjustment of the mA and/or kV according to patient size and/or use of iterative reconstruction technique. COMPARISON:  04/23/2019 FINDINGS: CT HEAD FINDINGS Brain: No evidence of acute infarction, hemorrhage, hydrocephalus, extra-axial collection or mass lesion/mass effect. Subcortical white matter and periventricular small vessel ischemic changes. Vascular: Intracranial atherosclerosis. Skull: Normal. Negative for fracture or focal lesion. Sinuses/Orbits: The visualized paranasal sinuses are  essentially clear. The mastoid air cells are unopacified. Other: None. CT CERVICAL SPINE FINDINGS Alignment: Normal cervical lordosis. Skull base and vertebrae: No acute fracture. No primary bone lesion or focal pathologic process. Soft tissues and spinal canal: No prevertebral fluid or swelling. No visible canal hematoma. Disc levels: Mild degenerative changes of the mid cervical spine. Spinal canal is patent. Upper chest: Visualized lung apices are clear. Other: Visualized thyroid is unremarkable. IMPRESSION: No acute intracranial abnormality. Small vessel ischemic changes. No traumatic injury to the cervical spine. Mild degenerative changes. Electronically Signed   By: Charline Bills M.D.   On: 04/01/2023 12:11   CT Cervical Spine Wo Contrast  Result Date: 04/01/2023 CLINICAL DATA:  Trauma EXAM: CT HEAD WITHOUT CONTRAST CT CERVICAL  SPINE WITHOUT CONTRAST TECHNIQUE: Multidetector CT imaging of the head and cervical spine was performed following the standard protocol without intravenous contrast. Multiplanar CT image reconstructions of the cervical spine were also generated. RADIATION DOSE REDUCTION: This exam was performed according to the departmental dose-optimization program which includes automated exposure control, adjustment of the mA and/or kV according to patient size and/or use of iterative reconstruction technique. COMPARISON:  04/23/2019 FINDINGS: CT HEAD FINDINGS Brain: No evidence of acute infarction, hemorrhage, hydrocephalus, extra-axial collection or mass lesion/mass effect. Subcortical white matter and periventricular small vessel ischemic changes. Vascular: Intracranial atherosclerosis. Skull: Normal. Negative for fracture or focal lesion. Sinuses/Orbits: The visualized paranasal sinuses are essentially clear. The mastoid air cells are unopacified. Other: None. CT CERVICAL SPINE FINDINGS Alignment: Normal cervical lordosis. Skull base and vertebrae: No acute fracture. No primary bone  lesion or focal pathologic process. Soft tissues and spinal canal: No prevertebral fluid or swelling. No visible canal hematoma. Disc levels: Mild degenerative changes of the mid cervical spine. Spinal canal is patent. Upper chest: Visualized lung apices are clear. Other: Visualized thyroid is unremarkable. IMPRESSION: No acute intracranial abnormality. Small vessel ischemic changes. No traumatic injury to the cervical spine. Mild degenerative changes. Electronically Signed   By: Charline Bills M.D.   On: 04/01/2023 12:11   DG Chest Portable 1 View  Result Date: 04/01/2023 CLINICAL DATA:  Shortness of breath. EXAM: PORTABLE CHEST 1 VIEW COMPARISON:  None Available. FINDINGS: Stable cardiomegaly is noted with mild central pulmonary vascular congestion. Minimal bibasilar subsegmental atelectasis or edema is noted. Bony thorax is unremarkable. IMPRESSION: Stable cardiomegaly with mild central pulmonary vascular congestion. Minimal bibasilar subsegmental atelectasis or edema is noted. Electronically Signed   By: Lupita Raider M.D.   On: 04/01/2023 11:00    Results are pending, will review when available.  Assessment and Plan:  * Generalized weakness Patient is presenting with increased generalized weakness with a ground-level fall with no injuries at this time.  Weakness is likely multifactorial in the setting of AKI and hyponatremia.  No evidence of hypervolemia on exam to suggest CHF involvement.  Vital signs are stable  - Management of AKI and hyponatremia as noted below - PT/OT  Acute kidney injury superimposed on chronic kidney disease (HCC) History of CKD stage IV with baseline creatinine between 1.3 and 1.6, currently 2.67.  This is in the setting of recent initiation of Bactrim, likely causing nephrotoxicity.  She appears euvolemic on examination and diuretics have not been recently changed.  - Avoid home nephrotoxic agents - Discontinue Bactrim - Gentle IV fluids - Strict in and  out - Bladder scan - Repeat BMP in the a.m.  Hyponatremia Patient presenting with hyponatremia at 128.  No prior history.  Given recent Bactrim use and AKI, likely drug-related.  - Gentle IV fluids - Repeat BMP in the a.m.  (HFpEF) heart failure with preserved ejection fraction (HCC) Although patient's BNP is elevated today at 566, she appears euvolemic on examination with no lower extremity swelling, no abdominal distention and no rales on examination.  - Hold home Lasix today given AKI - Resume once improvement in renal function - Daily weights - Strict in and out  Recurrent UTI (urinary tract infection) Patient states she has a history of recurrent UTI that was recently treated with Keflex, before her cardiologist changed her to Bactrim per culture data demonstrating Klebsiella with resistance to Keflex.  At this time, no indication for additional treatment given patient has completed 5 days.  Urinalysis  continues to have trace leukocytes and few bacteria, but patient is not experiencing any urinary or abdominal symptoms.  - Discontinue Bactrim  Anemia of chronic disease Hemoglobin has ranged between 9.4 and 11.5 over the last 6 months, currently 9.2.  Essential (primary) hypertension - Resume home antihypertensives, with the exception of Lasix and irbesartan given AKI  Advance Care Planning:   Code Status: DNR/DNI. Patients states she has discussed these wishes with her family.  Given her advanced age, she would not want heroic measures taken in the event of cardiac or pulmonary arrest  Consults: None  Family Communication: Patient's son-in-law updated at bedside  Severity of Illness: The appropriate patient status for this patient is OBSERVATION. Observation status is judged to be reasonable and necessary in order to provide the required intensity of service to ensure the patient's safety. The patient's presenting symptoms, physical exam findings, and initial radiographic and  laboratory data in the context of their medical condition is felt to place them at decreased risk for further clinical deterioration. Furthermore, it is anticipated that the patient will be medically stable for discharge from the hospital within 2 midnights of admission.   Author: Verdene Lennert, MD 04/01/2023 2:33 PM  For on call review [email or web address].

## 2023-04-02 ENCOUNTER — Observation Stay: Payer: Medicare PPO

## 2023-04-02 DIAGNOSIS — I5032 Chronic diastolic (congestive) heart failure: Secondary | ICD-10-CM | POA: Diagnosis present

## 2023-04-02 DIAGNOSIS — J449 Chronic obstructive pulmonary disease, unspecified: Secondary | ICD-10-CM | POA: Diagnosis present

## 2023-04-02 DIAGNOSIS — N179 Acute kidney failure, unspecified: Principal | ICD-10-CM | POA: Diagnosis present

## 2023-04-02 DIAGNOSIS — Z882 Allergy status to sulfonamides status: Secondary | ICD-10-CM | POA: Diagnosis not present

## 2023-04-02 DIAGNOSIS — E639 Nutritional deficiency, unspecified: Secondary | ICD-10-CM | POA: Diagnosis present

## 2023-04-02 DIAGNOSIS — Z881 Allergy status to other antibiotic agents status: Secondary | ICD-10-CM | POA: Diagnosis not present

## 2023-04-02 DIAGNOSIS — I130 Hypertensive heart and chronic kidney disease with heart failure and stage 1 through stage 4 chronic kidney disease, or unspecified chronic kidney disease: Secondary | ICD-10-CM | POA: Diagnosis present

## 2023-04-02 DIAGNOSIS — Z5320 Procedure and treatment not carried out because of patient's decision for unspecified reasons: Secondary | ICD-10-CM | POA: Diagnosis present

## 2023-04-02 DIAGNOSIS — I5033 Acute on chronic diastolic (congestive) heart failure: Secondary | ICD-10-CM | POA: Diagnosis not present

## 2023-04-02 DIAGNOSIS — E871 Hypo-osmolality and hyponatremia: Secondary | ICD-10-CM | POA: Diagnosis present

## 2023-04-02 DIAGNOSIS — Z87440 Personal history of urinary (tract) infections: Secondary | ICD-10-CM | POA: Diagnosis not present

## 2023-04-02 DIAGNOSIS — N170 Acute kidney failure with tubular necrosis: Secondary | ICD-10-CM | POA: Diagnosis present

## 2023-04-02 DIAGNOSIS — N390 Urinary tract infection, site not specified: Secondary | ICD-10-CM | POA: Diagnosis not present

## 2023-04-02 DIAGNOSIS — I10 Essential (primary) hypertension: Secondary | ICD-10-CM | POA: Diagnosis not present

## 2023-04-02 DIAGNOSIS — N184 Chronic kidney disease, stage 4 (severe): Secondary | ICD-10-CM | POA: Diagnosis present

## 2023-04-02 DIAGNOSIS — E890 Postprocedural hypothyroidism: Secondary | ICD-10-CM | POA: Diagnosis present

## 2023-04-02 DIAGNOSIS — D638 Anemia in other chronic diseases classified elsewhere: Secondary | ICD-10-CM | POA: Diagnosis not present

## 2023-04-02 DIAGNOSIS — Z79899 Other long term (current) drug therapy: Secondary | ICD-10-CM | POA: Diagnosis not present

## 2023-04-02 DIAGNOSIS — Z96649 Presence of unspecified artificial hip joint: Secondary | ICD-10-CM | POA: Diagnosis present

## 2023-04-02 DIAGNOSIS — Z66 Do not resuscitate: Secondary | ICD-10-CM | POA: Diagnosis present

## 2023-04-02 DIAGNOSIS — Z79890 Hormone replacement therapy (postmenopausal): Secondary | ICD-10-CM | POA: Diagnosis not present

## 2023-04-02 DIAGNOSIS — R531 Weakness: Secondary | ICD-10-CM | POA: Diagnosis present

## 2023-04-02 DIAGNOSIS — D631 Anemia in chronic kidney disease: Secondary | ICD-10-CM | POA: Diagnosis present

## 2023-04-02 DIAGNOSIS — J9601 Acute respiratory failure with hypoxia: Secondary | ICD-10-CM | POA: Diagnosis not present

## 2023-04-02 DIAGNOSIS — E872 Acidosis, unspecified: Secondary | ICD-10-CM | POA: Diagnosis present

## 2023-04-02 LAB — CBC
HCT: 24.2 % — ABNORMAL LOW (ref 36.0–46.0)
Hemoglobin: 7.9 g/dL — ABNORMAL LOW (ref 12.0–15.0)
MCH: 28.9 pg (ref 26.0–34.0)
MCHC: 32.6 g/dL (ref 30.0–36.0)
MCV: 88.6 fL (ref 80.0–100.0)
Platelets: 186 10*3/uL (ref 150–400)
RBC: 2.73 MIL/uL — ABNORMAL LOW (ref 3.87–5.11)
RDW: 14.9 % (ref 11.5–15.5)
WBC: 6.1 10*3/uL (ref 4.0–10.5)
nRBC: 0 % (ref 0.0–0.2)

## 2023-04-02 LAB — BASIC METABOLIC PANEL
Anion gap: 7 (ref 5–15)
BUN: 51 mg/dL — ABNORMAL HIGH (ref 8–23)
CO2: 19 mmol/L — ABNORMAL LOW (ref 22–32)
Calcium: 8.1 mg/dL — ABNORMAL LOW (ref 8.9–10.3)
Chloride: 105 mmol/L (ref 98–111)
Creatinine, Ser: 2.47 mg/dL — ABNORMAL HIGH (ref 0.44–1.00)
GFR, Estimated: 17 mL/min — ABNORMAL LOW (ref >60)
Glucose, Bld: 75 mg/dL (ref 70–99)
Potassium: 4.4 mmol/L (ref 3.5–5.1)
Sodium: 131 mmol/L — ABNORMAL LOW (ref 135–145)

## 2023-04-02 LAB — VITAMIN B12: Vitamin B-12: 300 pg/mL (ref 180–914)

## 2023-04-02 LAB — MAGNESIUM: Magnesium: 2 mg/dL (ref 1.7–2.4)

## 2023-04-02 LAB — IRON AND TIBC
Iron: 39 ug/dL (ref 28–170)
Saturation Ratios: 16 % (ref 10.4–31.8)
TIBC: 239 ug/dL — ABNORMAL LOW (ref 250–450)
UIBC: 200 ug/dL

## 2023-04-02 LAB — OSMOLALITY: Osmolality: 292 mOsm/kg (ref 275–295)

## 2023-04-02 LAB — PHOSPHORUS: Phosphorus: 3.5 mg/dL (ref 2.5–4.6)

## 2023-04-02 LAB — FOLATE: Folate: 9.9 ng/mL (ref >5.9)

## 2023-04-02 LAB — VITAMIN D 25 HYDROXY (VIT D DEFICIENCY, FRACTURES): Vit D, 25-Hydroxy: 41.32 ng/mL (ref 30–100)

## 2023-04-02 MED ORDER — SODIUM BICARBONATE 650 MG PO TABS
650.0000 mg | ORAL_TABLET | Freq: Three times a day (TID) | ORAL | Status: AC
  Administered 2023-04-02 – 2023-04-03 (×6): 650 mg via ORAL
  Filled 2023-04-02 (×6): qty 1

## 2023-04-02 MED ORDER — SODIUM CHLORIDE 0.9 % IV SOLN
1.0000 g | INTRAVENOUS | Status: DC
  Administered 2023-04-02 – 2023-04-04 (×3): 1 g via INTRAVENOUS
  Filled 2023-04-02 (×4): qty 10

## 2023-04-02 MED ORDER — CYANOCOBALAMIN 1000 MCG/ML IJ SOLN
1000.0000 ug | Freq: Every day | INTRAMUSCULAR | Status: AC
  Administered 2023-04-02 – 2023-04-04 (×3): 1000 ug via INTRAMUSCULAR
  Filled 2023-04-02 (×3): qty 1

## 2023-04-02 MED ORDER — VITAMIN B-12 1000 MCG PO TABS
1000.0000 ug | ORAL_TABLET | Freq: Every day | ORAL | Status: DC
  Administered 2023-04-05: 1000 ug via ORAL
  Filled 2023-04-02: qty 1

## 2023-04-02 MED ORDER — SODIUM CHLORIDE 0.9 % IV SOLN: INTRAVENOUS | Status: AC

## 2023-04-02 NOTE — TOC Progression Note (Signed)
Transition of Care [facility]) - Progression Note    Patient Details  Name: Julie Potts MRN: [id number] Date of Birth: [birth year]  Transition [facility]) CM/SW Contact  Marlowe Sax, RN Phone Number: 04/02/2023, 3:33 PM  Clinical Narrative:     Met with the daughter and the patient in the room to discuss DC plan, Reviewed  the MOON, I explained Medicare Guidelines, They are both in agreement that she go back to her apartment at Independent living at [facility]  Expected Discharge Plan: Home w Home Health Services Barriers to Discharge: No Barriers Identified  Expected Discharge Plan and Services   Discharge Planning Services: CM Consult   Living arrangements for the past 2 months: Apartment, Independent Living Facility                 DME Arranged: N/A DME Agency: NA Date DME Agency Contacted: 04/02/23     HH Arranged: PT, OT HH Agency:  ([facility]) Date HH Agency Contacted: 04/02/23 Time HH Agency Contacted: [id number] Representative spoke with at [facility] Agency: Selena Batten   Social Determinants of Health (SDOH) Interventions SDOH Screenings   Food Insecurity: No Food Insecurity (04/01/2023)  Housing: Low Risk  (04/01/2023)  Transportation Needs: No Transportation Needs (04/01/2023)  Utilities: Not At Risk (04/01/2023)  Financial Resource Strain: Low Risk  (07/11/2022)   Received from [facility], [facility]  Tobacco Use: Low Risk  (04/01/2023)    Readmission Risk Interventions     No data to display

## 2023-04-02 NOTE — Evaluation (Signed)
Physical Therapy Evaluation Patient Details Name: Julie Potts MRN: [id number] DOB: [birth year] Today's Date: 04/02/2023  History of Present Illness  Patient is a 89 year old female with fall and generalized weakness.  History significant of HFpEF, CKD stage IV, hypertension, hypothyroidism, recurrent UTI.    Clinical Impression  Patient is agreeable to PT evaluation. The patient is independent with mobility at baseline and uses a 4 wheeled walker for ambulation. She reports recent falls at home.  Today the patient required Min A for bed mobility for trunk support to sit upright with complaints of mild left flank pain. She was able to stand with supervision, no physical assistance required. Contact guard assistance provided for hallway ambulation using 4 wheeled walker with no rest breaks required. Patient reports mild dyspnea with Sp02 97% on room air after walk. The patient feels her general leg strength is improving but not back to her baseline. Recommend to continue PT to maximize independence and decrease caregiver burden.       If plan is discharge home, recommend the following: A little help with walking and/or transfers;A little help with bathing/dressing/bathroom;Assistance with cooking/housework;Assist for transportation   Can travel by private vehicle        Equipment Recommendations None recommended by PT  Recommendations for Other Services       Functional Status Assessment Patient has had a recent decline in their functional status and demonstrates the ability to make significant improvements in function in a reasonable and predictable amount of time.     Precautions / Restrictions Precautions Precautions: Fall Restrictions Weight Bearing Restrictions: No      Mobility  Bed Mobility Overal bed mobility: Needs Assistance Bed Mobility: Supine to Sit, Sit to Supine     Supine to sit: Min assist Sit to supine: Modified independent (Device/Increase time)    General bed mobility comments: trunk assistance provided to sit upright    Transfers Overall transfer level: Needs assistance Equipment used: Rollator (4 wheels) Transfers: Sit to/from Stand Sit to Stand: Supervision           General transfer comment: no physical assistance is required for transfers    Ambulation/Gait Ambulation/Gait assistance: Contact guard assist Gait Distance (Feet): 120 Feet Assistive device: Rollator (4 wheels) Gait Pattern/deviations: Step-through pattern Gait velocity: decreased     General Gait Details: no loss of balance with use of rollator for support. CGA for safety. further distance is limited by fatigue with activity. Sp02 97% on room air after walking  Stairs            Wheelchair Mobility     Tilt Bed    Modified Rankin (Stroke Patients Only)       Balance Overall balance assessment: History of Falls, Needs assistance Sitting-balance support: Feet supported Sitting balance-Leahy Scale: Fair     Standing balance support: Single extremity supported Standing balance-Leahy Scale: Fair Standing balance comment: using rollator for support in standing                             Pertinent Vitals/Pain Pain Assessment Pain Assessment: Faces Faces Pain Scale: Hurts little more Pain Location: L flank Pain Descriptors / Indicators: Discomfort, Grimacing Pain Intervention(s): Limited activity within patient's tolerance, Monitored during session, Repositioned    Home Living Family/patient expects to be discharged to:: Private residence (independent living at [facility]) Living Arrangements: Alone Available Help at Discharge: Family;Available 24 hours/day (daughter available if needed) Type of Home:  Apartment Home Access: Level entry;Elevator       Home Layout: One level Home Equipment: Rollator (4 wheels);Transport chair;Shower seat;BSC/3in1;Grab bars - toilet;Grab bars - tub/shower      Prior  Function Prior Level of Function : History of Falls (last six months);Independent/Modified Independent             Mobility Comments: uses rollator at all times for mobility. Hx of 3-4 falls in the past 3 months. Progressive weakness in legs for the past few weeks (complicated by UTI). ADLs Comments: Pt is typically (I) with ADL using rollator.     Extremity/Trunk Assessment   Upper Extremity Assessment Upper Extremity Assessment: Overall WFL for tasks assessed    Lower Extremity Assessment Lower Extremity Assessment: Generalized weakness (endurance impaired for sustained activity. no focal weakness is noted)       Communication   Communication Communication: No apparent difficulties  Cognition Arousal: Alert Behavior During Therapy: WFL for tasks assessed/performed Overall Cognitive Status: Within Functional Limits for tasks assessed                                 General Comments: patient is able to follow commands without difficulty        General Comments General comments (skin integrity, edema, etc.): encouraged patient to take rest breaks as needed with mobility. no seated rest breaks were required with  hallway ambulation. encouraged patient to continue using rollator for safety and fall prevention with longer distance ambulation attempts    Exercises     Assessment/Plan    PT Assessment Patient needs continued PT services  PT Problem List Decreased strength;Decreased activity tolerance;Decreased balance;Decreased mobility;Decreased safety awareness       PT Treatment Interventions DME instruction;Stair training;Gait training;Therapeutic activities;Functional mobility training;Therapeutic exercise;Balance training;Neuromuscular re-education;Cognitive remediation;Patient/family education    PT Goals (Current goals can be found in the Care Plan section)  Acute Rehab PT Goals Patient Stated Goal: to go home PT Goal Formulation: With patient Time  For Goal Achievement: 04/16/23 Potential to Achieve Goals: Good    Frequency Min 1X/week     Co-evaluation               AM-PAC PT "6 Clicks" Mobility  Outcome Measure Help needed turning from your back to your side while in a flat bed without using bedrails?: None Help needed moving from lying on your back to sitting on the side of a flat bed without using bedrails?: A Little Help needed moving to and from a bed to a chair (including a wheelchair)?: A Little Help needed standing up from a chair using your arms (e.g., wheelchair or bedside chair)?: A Little Help needed to walk in hospital room?: A Little Help needed climbing 3-5 steps with a railing? : A Little 6 Click Score: 19    End of Session Equipment Utilized During Treatment: Gait belt Activity Tolerance: Patient tolerated treatment well Patient left: in bed;with call bell/phone within reach;with bed alarm set   PT Visit Diagnosis: Unsteadiness on feet (R26.81);Muscle weakness (generalized) (M62.81)    Time: 1131-1150 PT Time Calculation (min) (ACUTE ONLY): 19 min   Charges:   PT Evaluation $PT Eval Low Complexity: 1 Low PT Treatments $Therapeutic Activity: 8-22 mins PT General Charges $$ ACUTE PT VISIT: 1 Visit         Donna Bernard, PT, MPT   Ina [employer] 04/02/2023, 12:09 PM

## 2023-04-02 NOTE — Plan of Care (Signed)

## 2023-04-02 NOTE — Progress Notes (Signed)
[facility] Progress Note  Patient: Julie Potts    HYQ:[id number]  DOA: 04/01/2023     Date of Service: the patient was seen and examined on 04/02/2023  Chief Complaint  Patient presents with   Fall   Brief hospital course: Rehanna Willborn is a 89 y.o. female with medical history significant of HFpEF, CKD stage IV, hypertension, hypothyroidism, recurrent UTI, who presents to the ED due to fall.  Patient says that her legs just gave out and fell down no other left side of face and lower ribs were hurting when she moves.  Patient had recurrent UTI, recently started on Bactrim 5 days ago.  No any other complaints at this time.  ED workup, hypertension BP elevated, Hyponatremia sodium 128, AKI creatinine 2.67, metabolic acidosis bicarb 19, BNP 566 elevated, anemia hemoglobin 9.2 UA trace LE, nitrate negative, WBC 11-20, few bacteria.  Not very impressive. CT head and C-spine negative for any acute findings CXR: Stable cardiomegaly with mild central pulmonary vascular congestion. Minimal bibasilar subsegmental atelectasis or edema is noted.  Assessment and Plan:  AKI on CKD stage IIIb Baseline creatinine 1.26 Creatinine 2.67 on admission Started gentle IV fluid for hydration Avoid nephrotoxic medications, use renally dose medications Follow renal sonogram and bladder scan to rule out urinary retention Cr 2.67---2.47 Monitor renal functions daily  Metabolic acidosis, Mild Started oral bicarbonate  Isotonic hyponatremia Serum osmolality 292 within normal range Hyponatremia most likely nutritional deficiency Monitor sodium level daily   HFpEF, BNP elevated 566 Watch for volume overload Patient is on IV fluid due to AKI Hold diuretics for now   Recurrent UTI, patient is recently treated with Bactrim, presented with AKI UA is not very impressive, monitor off antibiotics for now   Anemia of chronic disease Hemoglobin 7.9 Transferrin saturation 16%, at lower end, folate  10.9 WNL, vitamin B12 300 at lower end  Hypertension Continue amlodipine and hydralazine Monitor BP and titrate medications accordingly   Hypothyroid on Synthroid   Generalized weakness, multifactorial, could be due to AKI and hyponatremia and anemia of chronic disease Continue fall precautions and supportive care.  Replete electrolytes and multivitamins.  Vitamin B12 level 300, goal >400, started vitamin B12 1000 mcg IM injection during hospital stay followed by oral supplement on discharge.  COPD, stable Continued budesonide nebs and   Body mass index is 25.38 kg/m.  Interventions:  Diet: Regular diet DVT Prophylaxis: Subcutaneous Lovenox   Advance goals of care discussion: DNR  Family Communication: family was present at bedside, at the time of interview.  The pt provided permission to discuss medical plan with the family. Opportunity was given to ask question and all questions were answered satisfactorily.   Disposition:  Pt is from Home, admitted with Fall, AKI, Low Na, still has AKI with fluid, which precludes a safe discharge. Discharge to home with home health, when stable, may need 1-2 more days to stay in the hospital.  Subjective: No significant events overnight, patient just feels generalized weakness, no dysuria, no abdominal pain, no chest pain or palpitation, no any other active issues.  Physical Exam: General: NAD, lying comfortably Appear in no distress, affect appropriate Eyes: PERRLA ENT: Oral Mucosa Clear, moist  Neck: no JVD,  Cardiovascular: S1 and S2 Present, no Murmur,  Respiratory: good respiratory effort, Bilateral Air entry equal and Decreased, no Crackles, no wheezes Abdomen: Bowel Sound present, Soft and no tenderness,  Skin: no rashes Extremities: no Pedal edema, no calf tenderness Neurologic: without any new focal findings Gait  not checked due to patient safety concerns  Vitals:   04/01/23 2207 04/01/23 2316 04/02/23 0500 04/02/23 0817   BP: (!) 115/42 (!) 122/44  (!) 112/59  Pulse: 61 (!) 59  (!) 54  Resp:  18  15  Temp: 97.6 F (36.4 C) 98.1 F (36.7 C)  98.2 F (36.8 C)  TempSrc:      SpO2: 96% 93%  95%  Weight:   65 kg   Height:        Intake/Output Summary (Last 24 hours) at 04/02/2023 1343 Last data filed at 04/02/2023 1054 Gross per 24 hour  Intake 403.63 ml  Output --  Net 403.63 ml   Filed Weights   04/01/23 1021 04/02/23 0500  Weight: 64.9 kg 65 kg    Data Reviewed: I have personally reviewed and interpreted daily labs, tele strips, imagings as discussed above. I reviewed all nursing notes, pharmacy notes, vitals, pertinent old records I have discussed plan of care as described above with RN and patient/family.  CBC: Recent Labs  Lab 04/01/23 1028 04/02/23 0422  WBC 6.5 6.1  HGB 9.2* 7.9*  HCT 28.7* 24.2*  MCV 90.3 88.6  PLT 231 186   Basic Metabolic Panel: Recent Labs  Lab 04/01/23 1137 04/02/23 0422  NA 128* 131*  K 4.7 4.4  CL 99 105  CO2 19* 19*  GLUCOSE 85 75  BUN 54* 51*  CREATININE 2.67* 2.47*  CALCIUM 8.9 8.1*  MG  --  2.0  PHOS  --  3.5    Studies: No results found.  Scheduled Meds:  amLODipine  5 mg Oral Daily   budesonide (PULMICORT) nebulizer solution  0.25 mg Nebulization BID   enoxaparin (LOVENOX) injection  30 mg Subcutaneous Q24H   gabapentin  200 mg Oral Daily   hydrALAZINE  50 mg Oral TID   levothyroxine  50 mcg Oral Q0600   montelukast  10 mg Oral QHS   sodium bicarbonate  650 mg Oral TID   sodium chloride flush  3 mL Intravenous Q12H   Continuous Infusions:  sodium chloride 75 mL/hr at 04/02/23 1044   PRN Meds: acetaminophen **OR** acetaminophen, albuterol, HYDROcodone-acetaminophen, ondansetron **OR** ondansetron (ZOFRAN) IV, polyethylene glycol  Time spent: 35 minutes  Author: Gillis Santa. MD [facility] 04/02/2023 1:43 PM  To reach On-call, see care teams to locate the attending and reach out to them via [email or web address]. If  7PM-7AM, please contact night-coverage If you still have difficulty reaching the attending provider, please page the [facility] (Director on Call) for [facility] on amion for assistance.

## 2023-04-02 NOTE — TOC Progression Note (Signed)
Transition of Care [facility]) - Progression Note    Patient Details  Name: Julie Potts MRN: [id number] Date of Birth: June 20, 1923  Transition of Care [facility]) CM/SW Contact  Marlowe Sax, RN Phone Number: 04/02/2023, 1:59 PM  Clinical Narrative:    Spoke with the patient in the room at the bedside [place] living She wants to go home to her apartment and have the PT come there, I confirmed with Selena Batten at the [facility] of [place] that the PT can go to her home She has DME at home She stated that her family live near by Her family will transport   Expected Discharge Plan: Home w Home Health Services Barriers to Discharge: No Barriers Identified  Expected Discharge Plan and Services   Discharge Planning Services: CM Consult   Living arrangements for the past 2 months: Apartment, Independent Living Facility                 DME Arranged: N/A DME Agency: NA Date DME Agency Contacted: 04/02/23     HH Arranged: PT, OT HH Agency:  ([place] [place]) Date HH Agency Contacted: 04/02/23 Time HH Agency Contacted: [phone] Representative spoke with at [facility] Agency: Selena Batten   Social Determinants of Health (SDOH) Interventions SDOH Screenings   Food Insecurity: No Food Insecurity (04/01/2023)  Housing: Low Risk  (04/01/2023)  Transportation Needs: No Transportation Needs (04/01/2023)  Utilities: Not At Risk (04/01/2023)  Financial Resource Strain: Low Risk  (07/11/2022)   Received from [facility], [facility]  Tobacco Use: Low Risk  (04/01/2023)    Readmission Risk Interventions     No data to display

## 2023-04-02 NOTE — Progress Notes (Signed)
Pt and family at bedside decline urinary catheter insertion.  MD notified.  Order for bladder scans overnight to monitor for retention.  Plan to re-assess urinary catheter insertion in the morning.

## 2023-04-02 NOTE — Evaluation (Signed)
Occupational Therapy Evaluation Patient Details Name: Julie Potts MRN: [id number] DOB: 07-24-1923 Today's Date: 04/02/2023   History of Present Illness Julie Potts is a 89 y.o. female with medical history significant of HFpEF, CKD stage IV, hypertension, hypothyroidism, recurrent UTI, who presents to the ED due to fall.     Mrs. Julie Potts states that earlier today, she felt that both her legs had given out below her and she fell down and hit the right side of her face.  She denies any loss of consciousness.  She feels that weakness was throughout, including both her arms.  Prior to the fall, she denies any dizziness, palpitations, chest pain.  She notes that lately, she has not had any lower extremity swelling and shortness of breath with exertion has been stable and unchanged.  She has not had any recent medication changes with the exception of starting Bactrim approximately 5 days ago.  She notes that she typically cannot tell when she has a UTI stating urinary urgency has been chronic and unchanged for many years now.  She denies any dysuria.  At this time, she denies any symptoms other than weakness.   Clinical Impression   Patient received for OT evaluation. See flowsheet below for details of function. Generally, patient MOD (I) with HOB raised for bed mobility, CGA with RW and gait belt for short (5 ft) functional mobility, and set up-MOD A for ADLs. Patient will benefit from continued OT while in acute care.        If plan is discharge home, recommend the following: A little help with walking and/or transfers;A lot of help with bathing/dressing/bathroom;Assistance with cooking/housework;Assist for transportation    Functional Status Assessment  Patient has had a recent decline in their functional status and demonstrates the ability to make significant improvements in function in a reasonable and predictable amount of time.  Equipment Recommendations  None recommended by OT     Recommendations for Other Services       Precautions / Restrictions Precautions Precautions: Fall Restrictions Weight Bearing Restrictions: No      Mobility Bed Mobility Overal bed mobility: Modified Independent             General bed mobility comments: extra time and use of rails    Transfers Overall transfer level: Needs assistance Equipment used: Rolling walker (2 wheels) Transfers: Sit to/from Stand Sit to Stand: Contact guard assist           General transfer comment: extra time and effort      Balance Overall balance assessment: History of Falls, Mild deficits observed, not formally tested                                         ADL either performed or assessed with clinical judgement   ADL Overall ADL's : Needs assistance/impaired   Eating/Feeding Details (indicate cue type and reason): anticipate (I) Grooming: Wash/dry hands;Contact guard assist;Standing Grooming Details (indicate cue type and reason): stood at sink with CGA; one minimal LOB without UE support, self-corrected.   Upper Body Bathing Details (indicate cue type and reason): anticipate (I) from seated   Lower Body Bathing Details (indicate cue type and reason): anticipate MOD A from seated   Upper Body Dressing Details (indicate cue type and reason): anticipate (I) from seated Lower Body Dressing: Moderate assistance;Maximal assistance;Sit to/from stand Lower Body Dressing Details (indicate cue type and reason):  Asked pt to demonstrate how she dons socks/shoes; she states normally she leans forward; asked to do so today and she is unable due to pain in her low back. Will need signficant assist at d/c for LB dressing, especially threading.     Toileting- Clothing Manipulation and Hygiene: Minimal assistance Toileting - Clothing Manipulation Details (indicate cue type and reason): CGA from OT for standing during hygiene; OT set up toilet paper; MIN A for pulling up  underwear from around knees; pt then able to pull up further over hips.   Tub/Shower Transfer Details (indicate cue type and reason): anticipate CGA for safety Functional mobility during ADLs: Contact guard assist;Rolling walker (2 wheels) General ADL Comments: gait belt used for safety during standing ADLs today     Vision Patient Visual Report: No change from baseline       Perception         Praxis         Pertinent Vitals/Pain Pain Assessment Pain Assessment: 0-10 Pain Score:  (unrated, moderate) Pain Location: low back, L side Pain Descriptors / Indicators: Aching, Sore Pain Intervention(s): Limited activity within patient's tolerance, Monitored during session, Repositioned     Extremity/Trunk Assessment Upper Extremity Assessment Upper Extremity Assessment: Overall WFL for tasks assessed   Lower Extremity Assessment Lower Extremity Assessment: Defer to PT evaluation;Generalized weakness       Communication Communication Communication: No apparent difficulties   Cognition Arousal: Alert Behavior During Therapy: WFL for tasks assessed/performed Overall Cognitive Status: Within Functional Limits for tasks assessed                                 General Comments: Pleasant, follows all commands, fully oriented     General Comments  Pt able to sidestep and turn (approx 5 feet) to BSC at end of the bed with RW and CGA of OT; pt with poor eccentric control upon stand to sit t/f.    Exercises     Shoulder Instructions      Home Living Family/patient expects to be discharged to:: Private residence (independent living at [facility] [place]) Living Arrangements: Alone Available Help at Discharge: Family;Available 24 hours/day (daughter can provide 24/7 if needed) Type of Home: Apartment Home Access: Level entry;Elevator     Home Layout: One level     Bathroom Shower/Tub: Producer, television/film/video: Standard Bathroom Accessibility:  Yes How Accessible: Accessible via wheelchair Home Equipment: Rollator (4 wheels);Transport chair;Shower seat;BSC/3in1;Grab bars - toilet;Grab bars - tub/shower          Prior Functioning/Environment Prior Level of Function : History of Falls (last six months);Independent/Modified Independent             Mobility Comments: uses rollator at all times for mobility. Hx of 3-4 falls in the past 3 months. Progressive weakness in legs for the past few weeks (complicated by UTI). ADLs Comments: Pt is typically (I) with ADL using rollator.        OT Problem List: Decreased strength;Decreased activity tolerance;Impaired balance (sitting and/or standing);Decreased range of motion      OT Treatment/Interventions: Self-care/ADL training;Therapeutic exercise;DME and/or AE instruction;Therapeutic activities;Patient/family education    OT Goals(Current goals can be found in the care plan section) Acute Rehab OT Goals Patient Stated Goal: Pain and mobility to improve OT Goal Formulation: With patient/family Time For Goal Achievement: 04/16/23 Potential to Achieve Goals: Good ADL Goals Pt Will Perform Grooming: with modified  independence;standing Pt Will Perform Lower Body Dressing: with modified independence;with adaptive equipment;sit to/from stand Pt Will Transfer to Toilet: with modified independence;bedside commode;ambulating Pt Will Perform Toileting - Clothing Manipulation and hygiene: with modified independence;sit to/from stand  OT Frequency: Min 1X/week    Co-evaluation              AM-PAC OT "6 Clicks" Daily Activity     Outcome Measure Help from another person eating meals?: None Help from another person taking care of personal grooming?: A Little Help from another person toileting, which includes using toliet, bedpan, or urinal?: A Little Help from another person bathing (including washing, rinsing, drying)?: A Little Help from another person to put on and taking off  regular upper body clothing?: None Help from another person to put on and taking off regular lower body clothing?: A Lot 6 Click Score: 19   End of Session Equipment Utilized During Treatment: Gait belt;Rolling walker (2 wheels);Other (comment) [facility]) Nurse Communication: Mobility status;Other (comment) (toileting status)  Activity Tolerance: Patient tolerated treatment well Patient left: in bed;with call bell/phone within reach;with bed alarm set;with family/visitor present  OT Visit Diagnosis: Unsteadiness on feet (R26.81);Repeated falls (R29.6);Muscle weakness (generalized) (M62.81)                Time: 5784-6962 OT Time Calculation (min): 29 min Charges:  OT General Charges $OT Visit: 1 Visit OT Evaluation $OT Eval Moderate Complexity: 1 Mod OT Treatments $Self Care/Home Management : 8-22 mins  Linward Foster, MS, OTR/L  Alvester Morin 04/02/2023, 11:06 AM

## 2023-04-02 NOTE — Plan of Care (Signed)

## 2023-04-03 DIAGNOSIS — R531 Weakness: Secondary | ICD-10-CM | POA: Diagnosis not present

## 2023-04-03 LAB — MAGNESIUM: Magnesium: 2.2 mg/dL (ref 1.7–2.4)

## 2023-04-03 LAB — BASIC METABOLIC PANEL
Anion gap: 4 — ABNORMAL LOW (ref 5–15)
BUN: 41 mg/dL — ABNORMAL HIGH (ref 8–23)
CO2: 20 mmol/L — ABNORMAL LOW (ref 22–32)
Calcium: 7.5 mg/dL — ABNORMAL LOW (ref 8.9–10.3)
Chloride: 109 mmol/L (ref 98–111)
Creatinine, Ser: 1.98 mg/dL — ABNORMAL HIGH (ref 0.44–1.00)
GFR, Estimated: 23 mL/min — ABNORMAL LOW (ref >60)
Glucose, Bld: 85 mg/dL (ref 70–99)
Potassium: 4.2 mmol/L (ref 3.5–5.1)
Sodium: 133 mmol/L — ABNORMAL LOW (ref 135–145)

## 2023-04-03 LAB — CBC
HCT: 24.4 % — ABNORMAL LOW (ref 36.0–46.0)
Hemoglobin: 7.9 g/dL — ABNORMAL LOW (ref 12.0–15.0)
MCH: 28.9 pg (ref 26.0–34.0)
MCHC: 32.4 g/dL (ref 30.0–36.0)
MCV: 89.4 fL (ref 80.0–100.0)
Platelets: 187 10*3/uL (ref 150–400)
RBC: 2.73 MIL/uL — ABNORMAL LOW (ref 3.87–5.11)
RDW: 15 % (ref 11.5–15.5)
WBC: 6.3 10*3/uL (ref 4.0–10.5)
nRBC: 0 % (ref 0.0–0.2)

## 2023-04-03 LAB — PHOSPHORUS: Phosphorus: 2.7 mg/dL (ref 2.5–4.6)

## 2023-04-03 MED ORDER — SODIUM CHLORIDE 0.9 % IV SOLN: INTRAVENOUS | Status: DC

## 2023-04-03 MED ORDER — VITAMIN C 500 MG PO TABS
500.0000 mg | ORAL_TABLET | Freq: Every day | ORAL | Status: DC
  Administered 2023-04-03 – 2023-04-05 (×3): 500 mg via ORAL
  Filled 2023-04-03 (×3): qty 1

## 2023-04-03 MED ORDER — POLYSACCHARIDE IRON COMPLEX 150 MG PO CAPS
150.0000 mg | ORAL_CAPSULE | Freq: Every day | ORAL | Status: DC
  Administered 2023-04-03 – 2023-04-05 (×3): 150 mg via ORAL
  Filled 2023-04-03 (×3): qty 1

## 2023-04-03 NOTE — TOC Progression Note (Signed)
Transition of Care [facility]) - Progression Note    Patient Details  Name: Julie Potts MRN: [id number] Date of Birth: [birth year]  Transition [facility]) CM/SW Contact  Marlowe Sax, RN Phone Number: 04/03/2023, 3:24 PM  Clinical Narrative:     Spoke with the patient's daughter and they are in agreement to going home with [facility] I faxed the Home health orderws to [facility] of [place]  Expected Discharge Plan: Home w Home Health Services Barriers to Discharge: No Barriers Identified  Expected Discharge Plan and Services   Discharge Planning Services: CM Consult   Living arrangements for the past 2 months: Apartment, Independent Living Facility                 DME Arranged: N/A DME Agency: NA Date DME Agency Contacted: 04/02/23     HH Arranged: PT, OT HH Agency:  ([place] Date HH Agency Contacted: 04/02/23 Time HH Agency Contacted: [phone] Representative spoke with at [facility] Agency: Selena Batten   Social Determinants of Health (SDOH) Interventions SDOH Screenings   Food Insecurity: No Food Insecurity (04/01/2023)  Housing: Low Risk  (04/01/2023)  Transportation Needs: No Transportation Needs (04/01/2023)  Utilities: Not At Risk (04/01/2023)  Financial Resource Strain: Low Risk  (07/11/2022)   Received from [facility], [facility]  Tobacco Use: Low Risk  (04/01/2023)    Readmission Risk Interventions     No data to display

## 2023-04-03 NOTE — TOC Progression Note (Signed)
Transition of Care [facility]) - Progression Note    Patient Details  Name: Julie Potts MRN: [id number] Date of Birth: [birth year]  Transition of Care [facility]) CM/SW Contact  Marlowe Sax, RN Phone Number: 04/03/2023, 11:54 AM  Clinical Narrative:     The status has changed to inpatient, spoke with her daughter and she feels that the patient will need to go to STR prior to going to her independent living apartment, I reached out to therapy to see when they plan to work with the patgient today to see if she would meet criteria to go to STR and have Ins approve, awaiting therapy notes to send to insurance for approval  Expected Discharge Plan: Home w Home Health Services Barriers to Discharge: No Barriers Identified  Expected Discharge Plan and Services   Discharge Planning Services: CM Consult   Living arrangements for the past 2 months: Apartment, Independent Living Facility                 DME Arranged: N/A DME Agency: NA Date DME Agency Contacted: 04/02/23     HH Arranged: PT, OT HH Agency:  ([place] [place] Date HH Agency Contacted: 04/02/23 Time HH Agency Contacted: [phone] Representative spoke with at [facility] Agency: Selena Batten   Social Determinants of Health (SDOH) Interventions SDOH Screenings   Food Insecurity: No Food Insecurity (04/01/2023)  Housing: Low Risk  (04/01/2023)  Transportation Needs: No Transportation Needs (04/01/2023)  Utilities: Not At Risk (04/01/2023)  Financial Resource Strain: Low Risk  (07/11/2022)   Received from [facility], [facility]  Tobacco Use: Low Risk  (04/01/2023)    Readmission Risk Interventions     No data to display

## 2023-04-03 NOTE — Progress Notes (Signed)
Physical Therapy Treatment Patient Details Name: Julie Potts MRN: [id number] DOB: 23-Mar-1923 Today's Date: 04/03/2023   History of Present Illness Julie Potts is a 89 year old female with fall and generalized weakness.  History significant of HFpEF, CKD stage IV, hypertension, hypothyroidism, recurrent UTI.    PT Comments  Pt in recliner on entry, recently finished lunch and AMB around unit with OT. Pt agreeable to PT. AMB continued to improve in tolerance and speed (no longer decreased). Pt shows no LOB, demonstrates safe use of RW. Discussed rehab offerings in both HHPT and STR settings with pt and DTR. Will continue to follow.    If plan is discharge home, recommend the following: A little help with walking and/or transfers;A little help with bathing/dressing/bathroom;Assistance with cooking/housework;Assist for transportation   Can travel by private vehicle        Equipment Recommendations  None recommended by PT    Recommendations for Other Services       Precautions / Restrictions Precautions Precautions: Fall Restrictions Weight Bearing Restrictions: Yes     Mobility  Bed Mobility                    Transfers Overall transfer level: Needs assistance Equipment used: Rolling walker (2 wheels) Transfers: Sit to/from Stand Sit to Stand: Supervision           General transfer comment: slow and steady, mod effort from recliner.    Ambulation/Gait Ambulation/Gait assistance: Contact guard assist Gait Distance (Feet): 380 Feet Assistive device: Rolling walker (2 wheels) Gait Pattern/deviations: WFL(Within Functional Limits) Gait velocity: 0.23m/s     General Gait Details: Steady pacing, reports to feel generally good; Left rib pain not as prohibitive as feared;   Stairs             Wheelchair Mobility     Tilt Bed    Modified Rankin (Stroke Patients Only)       Balance                                             Cognition Arousal: Alert Behavior During Therapy: WFL for tasks assessed/performed Overall Cognitive Status: Within Functional Limits for tasks assessed                                          Exercises      General Comments        Pertinent Vitals/Pain Pain Assessment Pain Assessment: Faces Faces Pain Scale: Hurts even more (only when rolling in bed with staff, comfortable when sitting still) Pain Location: Left posterior ribs 10 and 4, brusing pattern seens, pt questions is sustained upon falling Pain Intervention(s): Limited activity within patient's tolerance, Monitored during session    Home Living                          Prior Function            PT Goals (current goals can now be found in the care plan section) Acute Rehab PT Goals Patient Stated Goal: to go home PT Goal Formulation: With patient Time For Goal Achievement: 04/16/23 Potential to Achieve Goals: Good Progress towards PT goals: Progressing toward goals    Frequency    Min 1X/week  PT Plan      Co-evaluation              AM-PAC PT "6 Clicks" Mobility   Outcome Measure  Help needed turning from your back to your side while in a flat bed without using bedrails?: None Help needed moving from lying on your back to sitting on the side of a flat bed without using bedrails?: A Little Help needed moving to and from a bed to a chair (including a wheelchair)?: A Little Help needed standing up from a chair using your arms (e.g., wheelchair or bedside chair)?: A Little Help needed to walk in hospital room?: A Little Help needed climbing 3-5 steps with a railing? : A Little 6 Click Score: 19    End of Session Equipment Utilized During Treatment: Gait belt Activity Tolerance: Patient tolerated treatment well;No increased pain Patient left: with call bell/phone within reach;in chair;with family/visitor present Nurse Communication: Mobility status PT Visit  Diagnosis: Unsteadiness on feet (R26.81);Muscle weakness (generalized) (M62.81)     Time: 0454-0981 PT Time Calculation (min) (ACUTE ONLY): 26 min  Charges:    $Therapeutic Activity: 23-37 mins PT General Charges $$ ACUTE PT VISIT: 1 Visit                    4:01 PM, 04/03/23 Rosamaria Lints, PT, DPT Physical Therapist - [facility]  [phone] (ASCOM)    Venicia Vandall C 04/03/2023, 4:00 PM

## 2023-04-03 NOTE — Progress Notes (Signed)
[facility] Progress Note  Patient: Julie Potts    AOZ:[id number]  DOA: 04/01/2023     Date of Service: the patient was seen and examined on 04/03/2023  Chief Complaint  Patient presents with   Fall   Brief hospital course: Alysah Vial is a 89 y.o. female with medical history significant of HFpEF, CKD stage IV, hypertension, hypothyroidism, recurrent UTI, who presents to the ED due to fall.  Patient says that her legs just gave out and fell down no other left side of face and lower ribs were hurting when she moves.  Patient had recurrent UTI, recently started on Bactrim 5 days ago.  No any other complaints at this time.  ED workup, hypertension BP elevated, Hyponatremia sodium 128, AKI creatinine 2.67, metabolic acidosis bicarb 19, BNP 566 elevated, anemia hemoglobin 9.2 UA trace LE, nitrate negative, WBC 11-20, few bacteria.  Not very impressive. CT head and C-spine negative for any acute findings CXR: Stable cardiomegaly with mild central pulmonary vascular congestion. Minimal bibasilar subsegmental atelectasis or edema is noted.  Assessment and Plan:  AKI on CKD stage IIIb Baseline creatinine 1.26 Creatinine 2.67 on admission Started gentle IV fluid for hydration Avoid nephrotoxic medications, use renally dose medications Follow renal sonogram and bladder scan to rule out urinary retention Cr 2.67---1.98 Monitor renal functions daily  Metabolic acidosis, Mild Co 20 Started oral bicarbonate  Isotonic hyponatremia Serum osmolality 292 within normal range Hyponatremia most likely nutritional deficiency Monitor sodium level daily Na 133  HFpEF, BNP elevated 566 Watch for volume overload Patient is on IV fluid due to AKI Hold diuretics for now   Recurrent UTI, patient is recently treated with Bactrim, presented with AKI UA is not very impressive, but patient's daughter is concerned about recurrent UTI 8/27 started ceftriaxone 1 g IV daily   Anemia of  chronic disease Hemoglobin 7.9 Transferrin saturation 16%, at lower end, folate 10.9 WNL, vitamin B12 300 at lower end Continue oral iron supplement  Hypertension Continue amlodipine and hydralazine Monitor BP and titrate medications accordingly   Hypothyroid on Synthroid   Generalized weakness, multifactorial, could be due to AKI and hyponatremia and anemia of chronic disease Continue fall precautions and supportive care.  Replete electrolytes and multivitamins.  Vitamin B12 level 300, goal >400, started vitamin B12 1000 mcg IM injection during hospital stay followed by oral supplement on discharge.  COPD, stable Continued budesonide nebs and   Body mass index is 25.38 kg/m.  Interventions:  Diet: Regular diet DVT Prophylaxis: Subcutaneous Lovenox   Advance goals of care discussion: DNR  Family Communication: family was present at bedside, at the time of interview.  The pt provided permission to discuss medical plan with the family. Opportunity was given to ask question and all questions were answered satisfactorily.   Disposition:  Pt is from Home, admitted with Fall, AKI, Low Na, still has AKI on IVF, which precludes a safe discharge. Discharge to home with home health, when stable, may need 1-2 more days to stay in the hospital.  Subjective: No significant events overnight, overall patient is feeling improvement, still having intermittent urinary retention, generalized weakness is getting better but she is max assistant. Discussed with patient's daughter at bedside, she will eval her whether she is good to go with home health versus SNF placement.   Physical Exam: General: NAD, lying comfortably Appear in no distress, affect appropriate Eyes: PERRLA ENT: Oral Mucosa Clear, moist  Neck: no JVD,  Cardiovascular: S1 and S2 Present, no Murmur,  Respiratory: good respiratory effort, Bilateral Air entry equal and Decreased, no Crackles, no wheezes Abdomen: Bowel Sound  present, Soft and no tenderness,  Skin: no rashes Extremities: no Pedal edema, no calf tenderness Neurologic: without any new focal findings Gait not checked due to patient safety concerns  Vitals:   04/03/23 0731 04/03/23 0828 04/03/23 0848 04/03/23 1617  BP:  (!) 150/47 (!) 161/50 (!) 133/49  Pulse:  64 66 63  Resp:  16 18 18   Temp:  98.1 F (36.7 C) 98 F (36.7 C) 97.7 F (36.5 C)  TempSrc:  Oral  Oral  SpO2: 98% 95% 96% 97%  Weight:      Height:        Intake/Output Summary (Last 24 hours) at 04/03/2023 1702 Last data filed at 04/03/2023 1029 Gross per 24 hour  Intake 1448.49 ml  Output 0 ml  Net 1448.49 ml   Filed Weights   04/01/23 1021 04/02/23 0500  Weight: 64.9 kg 65 kg    Data Reviewed: I have personally reviewed and interpreted daily labs, tele strips, imagings as discussed above. I reviewed all nursing notes, pharmacy notes, vitals, pertinent old records I have discussed plan of care as described above with RN and patient/family.  CBC: Recent Labs  Lab 04/01/23 1028 04/02/23 0422 04/03/23 0337  WBC 6.5 6.1 6.3  HGB 9.2* 7.9* 7.9*  HCT 28.7* 24.2* 24.4*  MCV 90.3 88.6 89.4  PLT 231 186 187   Basic Metabolic Panel: Recent Labs  Lab 04/01/23 1137 04/02/23 0422 04/03/23 0337  NA 128* 131* 133*  K 4.7 4.4 4.2  CL 99 105 109  CO2 19* 19* 20*  GLUCOSE 85 75 85  BUN 54* 51* 41*  CREATININE 2.67* 2.47* 1.98*  CALCIUM 8.9 8.1* 7.5*  MG  --  2.0 2.2  PHOS  --  3.5 2.7    Studies: No results found.  Scheduled Meds:  amLODipine  5 mg Oral Daily   vitamin C  500 mg Oral Daily   budesonide (PULMICORT) nebulizer solution  0.25 mg Nebulization BID   cyanocobalamin  1,000 mcg Intramuscular Q1200   Followed by   Melene Muller ON 04/05/2023] vitamin B-12  1,000 mcg Oral Daily   enoxaparin (LOVENOX) injection  30 mg Subcutaneous Q24H   gabapentin  200 mg Oral Daily   hydrALAZINE  50 mg Oral TID   iron polysaccharides  150 mg Oral Daily   levothyroxine  50  mcg Oral Q0600   montelukast  10 mg Oral QHS   sodium bicarbonate  650 mg Oral TID   sodium chloride flush  3 mL Intravenous Q12H   Continuous Infusions:  cefTRIAXone (ROCEPHIN)  IV Stopped (04/02/23 1919)   PRN Meds: acetaminophen **OR** acetaminophen, albuterol, HYDROcodone-acetaminophen, ondansetron **OR** ondansetron (ZOFRAN) IV, polyethylene glycol  Time spent: 35 minutes  Author: Gillis Santa. MD [facility] 04/03/2023 5:02 PM  To reach On-call, see care teams to locate the attending and reach out to them via [email or web address]. If 7PM-7AM, please contact night-coverage If you still have difficulty reaching the attending provider, please page the [facility] (Director on Call) for [facility] on amion for assistance.

## 2023-04-03 NOTE — Progress Notes (Signed)
Occupational Therapy Treatment Patient Details Name: Julie Potts MRN: [id number] DOB: 06/03/1923 Today's Date: 04/03/2023   History of present illness Julie Potts is a 89 year old female with fall and generalized weakness.  History significant of HFpEF, CKD stage IV, hypertension, hypothyroidism, recurrent UTI.   OT comments  Pt sitting up in chair upon OT arrival with daughter present in room.  Pt with good participation in ADLs, managing toilet transfer with min A and grab bar.  Pt reports her toilet is higher at home.  Tolerated standing with intermittent UE support for a brief change, but pt was unable to void.  Tolerated standing at sink to comb hair and wash hands, with 1 hand on sink countertop for support intermittently.  Pt eager to amb and tolerated 1 lap around nursing station with supv-min guard and rollator.  Returned to recliner end of session with all necessary items within reach.  Pt reporting being agreeable to [facility] or SNF, but not wanting to burden her daughter at home.  Pt appears appropriate for HH at this time.        If plan is discharge home, recommend the following:  A little help with walking and/or transfers;A lot of help with bathing/dressing/bathroom;Assistance with cooking/housework;Assist for transportation   Equipment Recommendations  None recommended by OT    Recommendations for Other Services      Precautions / Restrictions Precautions Precautions: Fall Restrictions Weight Bearing Restrictions: Yes       Mobility Bed Mobility Overal bed mobility: Needs Assistance Bed Mobility: Supine to Sit     Supine to sit: Modified independent (Device/Increase time), HOB elevated       Patient Response: Cooperative  Transfers Overall transfer level: Needs assistance Equipment used: Rolling walker (2 wheels) Transfers: Sit to/from Stand Sit to Stand: Supervision                 Balance Overall balance assessment: History of Falls, Needs  assistance Sitting-balance support: Feet supported Sitting balance-Leahy Scale: Fair     Standing balance support: Single extremity supported Standing balance-Leahy Scale: Fair Standing balance comment: using rollator for support in standing                           ADL either performed or assessed with clinical judgement   ADL Overall ADL's : Needs assistance/impaired     Grooming: Wash/dry hands;Brushing hair;Supervision/safety;Set up;Standing                   Toilet Transfer: Minimal assistance;Regular Toilet;Grab bars Toilet Transfer Details (indicate cue type and reason): pt reports her toilet at home is higher Toileting- Clothing Manipulation and Hygiene: Minimal assistance;Sit to/from stand       Functional mobility during ADLs: Contact guard assist;Rolling walker (2 wheels)      Extremity/Trunk Assessment Upper Extremity Assessment Upper Extremity Assessment: Overall WFL for tasks assessed   Lower Extremity Assessment Lower Extremity Assessment: Overall WFL for tasks assessed        Vision Patient Visual Report: No change from baseline     Perception     Praxis      Cognition Arousal: Alert Behavior During Therapy: WFL for tasks assessed/performed Overall Cognitive Status: Within Functional Limits for tasks assessed                                 General Comments: patient is able to  follow commands without difficulty        Exercises      Shoulder Instructions       General Comments Pt wearing diaper this date but normally wears a panty liner, per her report.  OT educated on timed voiding strategies with encouragement to attempt voiding every 2-3 hours to reduce accidents.  Pt was agreeable to attempt voiding in bathroom but was unable after sitting on commode for several min.    Pertinent Vitals/ Pain       Pain Assessment Pain Assessment: Faces Faces Pain Scale: Hurts even more Pain Location: low back, L  posterior ribs Pain Descriptors / Indicators: Discomfort, Grimacing Pain Intervention(s): Limited activity within patient's tolerance, Monitored during session, Repositioned  Home Living                                          Prior Functioning/Environment              Frequency  Min 1X/week        Progress Toward Goals  OT Goals(current goals can now be found in the care plan section)  Progress towards OT goals: Progressing toward goals  Acute Rehab OT Goals Patient Stated Goal: Pain and mobility to improve OT Goal Formulation: With patient/family Time For Goal Achievement: 04/16/23 Potential to Achieve Goals: Good  Plan      Co-evaluation                 AM-PAC OT "6 Clicks" Daily Activity     Outcome Measure   Help from another person eating meals?: None Help from another person taking care of personal grooming?: A Little Help from another person toileting, which includes using toliet, bedpan, or urinal?: A Little Help from another person bathing (including washing, rinsing, drying)?: A Little Help from another person to put on and taking off regular upper body clothing?: None Help from another person to put on and taking off regular lower body clothing?: A Lot 6 Click Score: 19    End of Session Equipment Utilized During Treatment: Gait belt;Other (comment) (rollator)  OT Visit Diagnosis: Unsteadiness on feet (R26.81);Repeated falls (R29.6);Muscle weakness (generalized) (M62.81)   Activity Tolerance Patient tolerated treatment well   Patient Left in chair;with call bell/phone within reach;with family/visitor present   Nurse Communication          Time: 6644-0347 OT Time Calculation (min): 25 min  Charges: OT General Charges $OT Visit: 1 Visit OT Treatments $Self Care/Home Management : 23-37 mins  Danelle Earthly, MS, OTR/L   Otis Dials 04/03/2023, 4:39 PM

## 2023-04-04 ENCOUNTER — Inpatient Hospital Stay: Payer: Medicare PPO

## 2023-04-04 ENCOUNTER — Encounter: Payer: Self-pay | Admitting: Student

## 2023-04-04 DIAGNOSIS — N184 Chronic kidney disease, stage 4 (severe): Secondary | ICD-10-CM

## 2023-04-04 DIAGNOSIS — I10 Essential (primary) hypertension: Secondary | ICD-10-CM | POA: Diagnosis not present

## 2023-04-04 DIAGNOSIS — I5032 Chronic diastolic (congestive) heart failure: Secondary | ICD-10-CM | POA: Diagnosis not present

## 2023-04-04 DIAGNOSIS — N170 Acute kidney failure with tubular necrosis: Secondary | ICD-10-CM

## 2023-04-04 DIAGNOSIS — N390 Urinary tract infection, site not specified: Secondary | ICD-10-CM | POA: Diagnosis not present

## 2023-04-04 DIAGNOSIS — R531 Weakness: Secondary | ICD-10-CM | POA: Diagnosis not present

## 2023-04-04 DIAGNOSIS — W19XXXA Unspecified fall, initial encounter: Secondary | ICD-10-CM

## 2023-04-04 DIAGNOSIS — D638 Anemia in other chronic diseases classified elsewhere: Secondary | ICD-10-CM | POA: Diagnosis not present

## 2023-04-04 LAB — CBC
HCT: 26.3 % — ABNORMAL LOW (ref 36.0–46.0)
Hemoglobin: 8.5 g/dL — ABNORMAL LOW (ref 12.0–15.0)
MCH: 28.7 pg (ref 26.0–34.0)
MCHC: 32.3 g/dL (ref 30.0–36.0)
MCV: 88.9 fL (ref 80.0–100.0)
Platelets: 189 K/uL (ref 150–400)
RBC: 2.96 MIL/uL — ABNORMAL LOW (ref 3.87–5.11)
RDW: 15.2 % (ref 11.5–15.5)
WBC: 6.6 K/uL (ref 4.0–10.5)
nRBC: 0 % (ref 0.0–0.2)

## 2023-04-04 LAB — BASIC METABOLIC PANEL
Anion gap: 8 (ref 5–15)
BUN: 35 mg/dL — ABNORMAL HIGH (ref 8–23)
CO2: 20 mmol/L — ABNORMAL LOW (ref 22–32)
Calcium: 7.6 mg/dL — ABNORMAL LOW (ref 8.9–10.3)
Chloride: 104 mmol/L (ref 98–111)
Creatinine, Ser: 1.74 mg/dL — ABNORMAL HIGH (ref 0.44–1.00)
GFR, Estimated: 26 mL/min — ABNORMAL LOW (ref >60)
Glucose, Bld: 91 mg/dL (ref 70–99)
Potassium: 4.5 mmol/L (ref 3.5–5.1)
Sodium: 132 mmol/L — ABNORMAL LOW (ref 135–145)

## 2023-04-04 LAB — MAGNESIUM: Magnesium: 2 mg/dL (ref 1.7–2.4)

## 2023-04-04 LAB — PHOSPHORUS: Phosphorus: 2.8 mg/dL (ref 2.5–4.6)

## 2023-04-04 MED ORDER — AMLODIPINE BESYLATE 5 MG PO TABS: 5.0000 mg | ORAL_TABLET | Freq: Every day | ORAL | Status: DC | PRN

## 2023-04-04 MED ORDER — FUROSEMIDE 10 MG/ML IJ SOLN
40.0000 mg | Freq: Once | INTRAMUSCULAR | Status: DC
  Filled 2023-04-04: qty 4

## 2023-04-04 MED ORDER — FUROSEMIDE 10 MG/ML IJ SOLN: 20.0000 mg | Freq: Two times a day (BID) | INTRAMUSCULAR | Status: DC

## 2023-04-04 MED ORDER — SODIUM BICARBONATE 650 MG PO TABS
650.0000 mg | ORAL_TABLET | Freq: Three times a day (TID) | ORAL | Status: DC
  Administered 2023-04-04 – 2023-04-05 (×3): 650 mg via ORAL
  Filled 2023-04-04 (×3): qty 1

## 2023-04-04 MED ORDER — METOPROLOL SUCCINATE ER 50 MG PO TB24
50.0000 mg | ORAL_TABLET | Freq: Every day | ORAL | Status: DC
  Administered 2023-04-04 – 2023-04-05 (×2): 50 mg via ORAL
  Filled 2023-04-04 (×2): qty 1

## 2023-04-04 MED ORDER — FUROSEMIDE 10 MG/ML IJ SOLN: 20.0000 mg | INTRAMUSCULAR | Status: DC | PRN

## 2023-04-04 NOTE — Progress Notes (Addendum)
[facility] Progress Note  Patient: Julie Potts    WNU:[id number]  DOA: 04/01/2023     Date of Service: the patient was seen and examined on 04/04/2023  Chief Complaint  Patient presents with   Fall   Brief hospital course: Julie Potts is a 89 y.o. female with medical history significant of HFpEF, CKD stage IV, hypertension, hypothyroidism, recurrent UTI, who presents to the ED due to fall.  Patient says that her legs just gave out and fell down no other left side of face and lower ribs were hurting when she moves.  Patient had recurrent UTI, recently started on Bactrim 5 days ago.  No any other complaints at this time.  ED workup, hypertension BP elevated, Hyponatremia sodium 128, AKI creatinine 2.67, metabolic acidosis bicarb 19, BNP 566 elevated, anemia hemoglobin 9.2 UA trace LE, nitrate negative, WBC 11-20, few bacteria.  Not very impressive. CT head and C-spine negative for any acute findings CXR: Stable cardiomegaly with mild central pulmonary vascular congestion. Minimal bibasilar subsegmental atelectasis or edema is noted.  Assessment and Plan:  AKI on CKD stage IIIb Baseline creatinine 1.26 Creatinine 2.67 on admission, Held Lasix and irbesartan  S/p gentle IV fluid for hydration, DC'd on 8/29 due to DOE Avoid nephrotoxic medications, use renally dose medications Renal US ruled out obstruction bladder scan showed intermittent urinary retention.  Patient refused Foley catheter insertion Cr 2.67---1.74 gradually improving Monitor renal functions daily  Acute hypoxic respiratory failure developed on 8/29 HFpEF, BNP elevated 566 S/p IV fluid gentle hydration given for AKI Held Lasix and irbesartan due to AKI 8/29 DC'd IVF c/o dyspnea on exertion, O2 sat dropped, supplemental oxygen given CXR: 1. Improving central pulmonary vascular congestion. 2. Persistent left lower lobe atelectasis and/or consolidation with pleural effusion. 8/29 Lasix 20 mg IV BID x 2  dose Continue supplemental O2 admission and gradually wean off Follow 2D echocardiogram Family requested cardiology consult   Metabolic acidosis, Mild Co 20 Started oral bicarbonate  Isotonic hyponatremia Serum osmolality 292 within normal range Hyponatremia most likely nutritional deficiency Monitor sodium level daily Na 132    Recurrent UTI, patient is recently treated with Bactrim, presented with AKI UA is not very impressive, but patient's daughter is concerned about recurrent UTI 8/27 started ceftriaxone 1 g IV daily   Anemia of chronic disease Hemoglobin 8.5 Transferrin saturation 16%, at lower end, folate 10.9 WNL, vitamin B12 300 at lower end Continue oral iron supplement  Hypertension Continue amlodipine and hydralazine Resumed metoprolol 50 mg p.o. daily home dose Held Lasix and irbesartan due to AKI Monitor BP and titrate medications accordingly   Hypothyroid on Synthroid   Generalized weakness, multifactorial, could be due to AKI and hyponatremia and anemia of chronic disease Continue fall precautions and supportive care.  Replete electrolytes and multivitamins.  Vitamin B12 level 300, goal >400, started vitamin B12 1000 mcg IM injection daily x 3 doses followed by oral supplement on discharge.  COPD, stable Continued budesonide nebs and   Body mass index is 25.38 kg/m.  Interventions:  Diet: Regular diet DVT Prophylaxis: Subcutaneous Lovenox   Advance goals of care discussion: DNR  Family Communication: family was present at bedside, at the time of interview.  The pt provided permission to discuss medical plan with the family. Opportunity was given to ask question and all questions were answered satisfactorily.   Disposition:  Pt is from Home, admitted with Fall, AKI, Low Na, still has AKI on IVF, which precludes a safe discharge. Discharge to  home with home health, when stable, may need 1-2 more days to stay in the hospital.  Subjective: No  significant events overnight, patient was sitting comfortably in the recliner, stated that she is feeling dyspnea on exertion, no any other complaints. Later on patient became hypoxic in the afternoon, patient's daughter was requesting to resume Lasix.  Chest x-ray ordered and Lasix 40 mg one-time dose ordered.   Physical Exam: General: NAD, lying comfortably Appear in no distress, affect appropriate Eyes: PERRLA ENT: Oral Mucosa Clear, moist  Neck: no JVD,  Cardiovascular: S1 and S2 Present, no Murmur,  Respiratory: Equal air entry bilaterally, mild crackles, no wheezing. Abdomen: Bowel Sound present, Soft and no tenderness,  Skin: no rashes Extremities: no Pedal edema, no calf tenderness Neurologic: without any new focal findings Gait not checked due to patient safety concerns  Vitals:   04/03/23 2132 04/03/23 2325 04/04/23 0807 04/04/23 0846  BP:  (!) 142/43  (!) 166/49  Pulse:  72  76  Resp:  18  16  Temp:  98.3 F (36.8 C)  97.8 F (36.6 C)  TempSrc:  Oral    SpO2: 96% 95% 96% 95%  Weight:      Height:        Intake/Output Summary (Last 24 hours) at 04/04/2023 1354 Last data filed at 04/04/2023 0900 Gross per 24 hour  Intake 980.49 ml  Output --  Net 980.49 ml   Filed Weights   04/01/23 1021 04/02/23 0500  Weight: 64.9 kg 65 kg    Data Reviewed: I have personally reviewed and interpreted daily labs, tele strips, imagings as discussed above. I reviewed all nursing notes, pharmacy notes, vitals, pertinent old records I have discussed plan of care as described above with RN and patient/family.  CBC: Recent Labs  Lab 04/01/23 1028 04/02/23 0422 04/03/23 0337 04/04/23 0351  WBC 6.5 6.1 6.3 6.6  HGB 9.2* 7.9* 7.9* 8.5*  HCT 28.7* 24.2* 24.4* 26.3*  MCV 90.3 88.6 89.4 88.9  PLT 231 186 187 189   Basic Metabolic Panel: Recent Labs  Lab 04/01/23 1137 04/02/23 0422 04/03/23 0337 04/04/23 0351  NA 128* 131* 133* 132*  K 4.7 4.4 4.2 4.5  CL 99 105 109 104   CO2 19* 19* 20* 20*  GLUCOSE 85 75 85 91  BUN 54* 51* 41* 35*  CREATININE 2.67* 2.47* 1.98* 1.74*  CALCIUM 8.9 8.1* 7.5* 7.6*  MG  --  2.0 2.2 2.0  PHOS  --  3.5 2.7 2.8    Studies: No results found.  Scheduled Meds:  amLODipine  5 mg Oral Daily   vitamin C  500 mg Oral Daily   budesonide (PULMICORT) nebulizer solution  0.25 mg Nebulization BID   cyanocobalamin  1,000 mcg Intramuscular Q1200   Followed by   Melene Muller ON 04/05/2023] vitamin B-12  1,000 mcg Oral Daily   enoxaparin (LOVENOX) injection  30 mg Subcutaneous Q24H   furosemide  40 mg Intravenous Once   gabapentin  200 mg Oral Daily   hydrALAZINE  50 mg Oral TID   iron polysaccharides  150 mg Oral Daily   levothyroxine  50 mcg Oral Q0600   montelukast  10 mg Oral QHS   sodium chloride flush  3 mL Intravenous Q12H   Continuous Infusions:  cefTRIAXone (ROCEPHIN)  IV Stopped (04/03/23 2101)   PRN Meds: acetaminophen **OR** acetaminophen, albuterol, HYDROcodone-acetaminophen, ondansetron **OR** ondansetron (ZOFRAN) IV, polyethylene glycol  Time spent: 35 minutes  Author: Gillis Santa. MD [facility] 04/04/2023  1:54 PM  To reach On-call, see care teams to locate the attending and reach out to them via [email or web address]. If 7PM-7AM, please contact night-coverage If you still have difficulty reaching the attending provider, please page the [facility] (Director on Call) for [facility] on amion for assistance.

## 2023-04-04 NOTE — Progress Notes (Signed)
Patient is incontinent and is using home adult briefs.  Per patient  MD did not authorize the use of purewick.  Today pt has had one wet adult brief removed.

## 2023-04-04 NOTE — Plan of Care (Signed)

## 2023-04-04 NOTE — Consult Note (Signed)
Cardiology Consultation   Patient ID: Akelia Laughton MRN: [id number]; DOB: 07-Nov-1922  Admit date: 04/01/2023 Date of Consult: 04/04/2023  PCP:  Damaris Schooner, DO   [facility] Providers Cardiologist:  None      Follows with [facility]  Patient Profile:   Bhargavi Wallock is a 89 y.o. female with a hx of chronic HFpEF, CKD stage IV, hypertension, hypothyroidism, recurrent UTIs, anemia, who is being seen 04/04/2023 for the evaluation of shortness of breath and dyspnea on exertion at the request of Dr Lucianne Muss.  History of Present Illness:   Ms. Basinger has a longstanding history of chronic HFpEF followed by [facility] cardiology.  Last echocardiogram was completed in March with an EF greater than 55% per report.  She presents today office Emergency Department 04/01/2023 after mechanical fall.  Daughter remains at the bedside offering history and events leading up to hospitalization.  Daughter stated that her legs gave out and she was very ill striking her head.  Denied any chest pain or shortness of breath on arrival.  She denies any lightheadedness, dizziness, or lower extremity swelling.  Per daughter she has been battling symptoms of just not feeling well for the past 2 weeks.  She was thinking that it was a UTI so she took her to urgent care in [place] where she was treated for a UTI and was started on antibiotic.  The next week she followed up with one of her providers and the urine culture had resulted and antibiotic therapy was changed.  Said she had been taking Bactrim for approximately 5 days prior to arrival.  Initial vital signs: Blood pressure 161/49, pulse 65, respirations 20, temperature 97.8  Pertinent labs: Hemoglobin 9.2, 7.9, 7.9, 8.5, BNP 566.3, sodium 128, CO2 19, BUN of 54, serum creatinine of 2.67, estimated GFR 16  Imaging: Chest x-ray revealed improving central pulmonary vascular congestion, persistent left lower lobe atelectasis and/or consolidation with  pleural effusion  Medications administered in the emergency department 500 mL of normal saline  With the patient's AKI on CKD on admission she was started on gentle hydration and her home irbesartan and furosemide were held.  Unfortunately she complained of dyspnea on exertion starting last evening his IV fluids were discontinued this morning, she had a drop in her oxygen saturation and was placed on supplemental oxygen 2 L O2 via nasal cannula.  Repeated chest x-ray revealed improving central pulmonary vascular congestion with persistent left lower lobe atelectasis and/or consolidation with pleural effusion.  She was then ordered furosemide 20 mg IV twice daily x 2 doses and cardiology was consulted.   Past Medical History:  Diagnosis Date   Hypertension     Past Surgical History:  Procedure Laterality Date   APPENDECTOMY     BACK SURGERY     CHOLECYSTECTOMY     knee repacement     THYROIDECTOMY     TONSILLECTOMY     TOTAL HIP ARTHROPLASTY       Home Medications:  Prior to Admission medications   Medication Sig Start Date End Date Taking? Authorizing Provider  albuterol (VENTOLIN HFA) 108 (90 Base) MCG/ACT inhaler Inhale 2 puffs into the lungs every 6 (six) hours as needed. 03/19/23 03/18/24 Yes [provider]  amLODipine (NORVASC) 5 MG tablet Take 1 tablet by mouth daily. 02/04/23 02/28/24 Yes [provider]  Fluticasone Propionate, Inhal, (FLUTICASONE PROPIONATE DISKUS) 250 MCG/ACT AEPB Take 1 puff by mouth daily.   Yes [provider]  furosemide (LASIX) 20 MG  tablet Take 20 mg by mouth 2 (two) times daily.   Yes [provider]  gabapentin (NEURONTIN) 100 MG capsule Take 200 mg by mouth daily.   Yes [provider]  guaiFENesin (ROBITUSSIN) 100 MG/5ML SOLN Take 5 mLs (100 mg total) by mouth every 4 (four) hours as needed for cough or to loosen phlegm. 12/10/15  Yes Sharman Cheek, MD  hydrALAZINE (APRESOLINE) 50 MG tablet Take 1 tablet  by mouth 3 (three) times daily. 05/12/21  Yes [provider]  HYDROcodone-acetaminophen (NORCO/VICODIN) 5-325 MG tablet Take 1 tablet by mouth 2 (two) times daily as needed.   Yes [provider]  irbesartan (AVAPRO) 300 MG tablet Take 300 mg by mouth daily.   Yes [provider]  levothyroxine (SYNTHROID) 50 MCG tablet Take 50 mcg by mouth every morning.   Yes [provider]  metoprolol succinate (TOPROL-XL) 50 MG 24 hr tablet Take 50 mg by mouth daily. 02/28/23  Yes [provider]  montelukast (SINGULAIR) 10 MG tablet Take 10 mg by mouth daily.   Yes [provider]  nitrofurantoin (MACRODANTIN) 50 MG capsule Take 1 capsule by mouth at bedtime. Patient not taking: Reported on 04/01/2023 02/08/23   [provider]  ondansetron (ZOFRAN ODT) 4 MG disintegrating tablet Take 1 tablet (4 mg total) by mouth every 8 (eight) hours as needed for nausea or vomiting. Patient not taking: Reported on 04/01/2023 12/10/15   Sharman Cheek, MD    Inpatient Medications: Scheduled Meds:  amLODipine  5 mg Oral Daily   vitamin C  500 mg Oral Daily   budesonide (PULMICORT) nebulizer solution  0.25 mg Nebulization BID   [START ON 04/05/2023] vitamin B-12  1,000 mcg Oral Daily   enoxaparin (LOVENOX) injection  30 mg Subcutaneous Q24H   furosemide  20 mg Intravenous BID   gabapentin  200 mg Oral Daily   hydrALAZINE  50 mg Oral TID   iron polysaccharides  150 mg Oral Daily   levothyroxine  50 mcg Oral Q0600   metoprolol succinate  50 mg Oral Daily   montelukast  10 mg Oral QHS   sodium bicarbonate  650 mg Oral TID   sodium chloride flush  3 mL Intravenous Q12H   Continuous Infusions:  cefTRIAXone (ROCEPHIN)  IV Stopped (04/03/23 2101)   PRN Meds: acetaminophen **OR** acetaminophen, albuterol, HYDROcodone-acetaminophen, ondansetron **OR** ondansetron (ZOFRAN) IV, polyethylene glycol  Allergies:    Allergies  Allergen Reactions   Ciprofloxacin  Nausea Only   Levofloxacin Other (See Comments)    Weakness, "could not use legs"   Sulfa Antibiotics Rash    Social History:   Social History   Socioeconomic History   Marital status: Widowed    Spouse name: Not on file   Number of children: Not on file   Years of education: Not on file   Highest education level: Not on file  Occupational History   Not on file  Tobacco Use   Smoking status: Never   Smokeless tobacco: Never  Substance and Sexual Activity   Alcohol use: No   Drug use: Not on file   Sexual activity: Not on file  Other Topics Concern   Not on file  Social History Narrative   Not on file   Social Determinants of Health   Financial Resource Strain: Low Risk  (07/11/2022)   Received from [facility]   Overall Financial Resource Strain (CARDIA)    Difficulty of Paying Living Expenses:  Not hard at all  Food Insecurity: No Food Insecurity (04/01/2023)   Hunger Vital Sign    Worried About Running Out of Food in the Last Year: Never true    Ran Out of Food in the Last Year: Never true  Transportation Needs: No Transportation Needs (04/01/2023)   PRAPARE - Administrator, Civil Service (Medical): No    Lack of Transportation (Non-Medical): No  Physical Activity: Not on file  Stress: Not on file  Social Connections: Not on file  Intimate Partner Violence: Not At Risk (04/01/2023)   Humiliation, Afraid, Rape, and Kick questionnaire    Fear of Current or Ex-Partner: No    Emotionally Abused: No    Physically Abused: No    Sexually Abused: No    Family History:   History reviewed. No pertinent family history.   ROS:  Please see the history of present illness.  Review of Systems  Constitutional:  Positive for malaise/fatigue.  Respiratory:  Positive for shortness of breath.   Genitourinary:  Positive for frequency and urgency.  Musculoskeletal:  Positive for falls.  Neurological:  Positive for  weakness.    All other ROS reviewed and negative.     Physical Exam/Data:   Vitals:   04/03/23 2325 04/04/23 0807 04/04/23 0846 04/04/23 1409  BP: (!) 142/43  (!) 166/49 (!) 156/53  Pulse: 72  76 73  Resp: 18  16 17   Temp: 98.3 F (36.8 C)  97.8 F (36.6 C) 97.8 F (36.6 C)  TempSrc: Oral     SpO2: 95% 96% 95% 99%  Weight:      Height:        Intake/Output Summary (Last 24 hours) at 04/04/2023 1547 Last data filed at 04/04/2023 0900 Gross per 24 hour  Intake 980.49 ml  Output --  Net 980.49 ml      04/02/2023    5:00 AM 04/01/2023   10:21 AM 04/23/2019    9:13 AM  Last 3 Weights  Weight (lbs) 143 lb 4.8 oz 143 lb 145 lb  Weight (kg) 65 kg 64.864 kg 65.772 kg     Body mass index is 25.38 kg/m.  General:  Frail, chronically ill appearing, in no acute distress HEENT: normal Neck: no JVD Vascular: No carotid bruits; Distal pulses 2+ bilaterally Cardiac:  normal S1, S2; RRR; no murmur  Lungs:  clear to auscultation bilaterally, respirations are unlabored at rest on 2L O2 via [place] Abd: soft, nontender, no hepatomegaly  Ext: no edema Musculoskeletal:  No deformities, BUE and BLE strength normal and equal Skin: warm and dry  Neuro:  CNs 2-12 intact, no focal abnormalities noted Psych:  Normal affect   EKG:  The EKG was personally reviewed and demonstrates: EKG not completed EKG done at outside facility [facility]) 10/10/22  SINUS RHYTHM WITH PREMATURE ATRIAL BEATS  BORDERLINE ECG  WHEN COMPARED WITH ECG OF 08-Oct-2022 18:21,  NO SIGNIFICANT CHANGE WAS FOUND   Telemetry:  Telemetry was personally reviewed and demonstrates: Not currently on telemetry  Relevant CV Studies: Echocardiogram ordered and pending  TTE done at outside facility [facility]) 10/10/22 Summary   1. The left ventricle is normal in size with mildly increased wall  thickness.   2. The left ventricular systolic function is normal, LVEF is visually  estimated at > 55%.    3. The left atrium is moderately dilated  in size.    4. The right ventricle is normal in size, with normal systolic function.  5. IVC size and inspiratory change suggest mildly elevated right atrial  pressure. (5-10 mmHg).   Laboratory Data:  High Sensitivity Troponin:  No results for input(s): "TROPONINIHS" in the last 720 hours.   Chemistry Recent Labs  Lab 04/02/23 0422 04/03/23 0337 04/04/23 0351  NA 131* 133* 132*  K 4.4 4.2 4.5  CL 105 109 104  CO2 19* 20* 20*  GLUCOSE 75 85 91  BUN 51* 41* 35*  CREATININE 2.47* 1.98* 1.74*  CALCIUM 8.1* 7.5* 7.6*  MG 2.0 2.2 2.0  GFRNONAA 17* 23* 26*  ANIONGAP 7 4* 8    No results for input(s): "PROT", "ALBUMIN", "AST", "ALT", "ALKPHOS", "BILITOT" in the last 168 hours. Lipids No results for input(s): "CHOL", "TRIG", "HDL", "LABVLDL", "LDLCALC", "CHOLHDL" in the last 168 hours.  Hematology Recent Labs  Lab 04/02/23 0422 04/03/23 0337 04/04/23 0351  WBC 6.1 6.3 6.6  RBC 2.73* 2.73* 2.96*  HGB 7.9* 7.9* 8.5*  HCT 24.2* 24.4* 26.3*  MCV 88.6 89.4 88.9  MCH 28.9 28.9 28.7  MCHC 32.6 32.4 32.3  RDW 14.9 15.0 15.2  PLT 186 187 189   Thyroid No results for input(s): "TSH", "FREET4" in the last 168 hours.  BNP Recent Labs  Lab 04/01/23 1109  BNP 566.3*    DDimer No results for input(s): "DDIMER" in the last 168 hours.   Radiology/Studies:  [facility] Chest Port 1 View  Result Date: 04/04/2023 CLINICAL DATA:  Shortness of breath. EXAM: PORTABLE CHEST 1 VIEW COMPARISON:  04/01/2023. FINDINGS: Central pulmonary vascular congestion, improved since the prior study. No frank pulmonary edema. Redemonstration of left retrocardiac airspace opacity obscuring the left hemidiaphragm, descending thoracic aorta and blunting the left lateral costophrenic angle suggesting combination of left lower lobe atelectasis and/or consolidation with pleural effusion. Bilateral lung fields are otherwise clear. No dense consolidation or major lung collapse. Right lateral costophrenic angle is clear.  Stable mildly enlarged cardio-mediastinal silhouette. No acute osseous abnormalities. The soft tissues are within normal limits. IMPRESSION: 1. Improving central pulmonary vascular congestion. 2. Persistent left lower lobe atelectasis and/or consolidation with pleural effusion. Electronically Signed   By: Jules Schick M.D.   On: 04/04/2023 13:55   US RENAL  Result Date: 04/02/2023 CLINICAL DATA:  Acute kidney injury. EXAM: RENAL / URINARY TRACT ULTRASOUND COMPLETE COMPARISON:  None Available. FINDINGS: Right Kidney: Renal measurements: 9.3 cm x 4.6 cm x 5.0 cm = volume: 112.03 mL. Echogenicity within normal limits. No mass or hydronephrosis visualized. Left Kidney: Renal measurements: 8.3 cm x 4.2 cm x 4.4 cm = volume: 80.05 mL. Diffusely increased echogenicity of the left kidney is noted. A 2.1 cm x 2.3 cm x 2.1 cm simple cyst is seen within the upper pole of the left kidney. No hydronephrosis is visualized. Bladder: Appears normal for degree of bladder distention. Other: None. IMPRESSION: 1. Echogenic left kidney which may be, in part, secondary to medical renal disease. 2. Simple left renal cyst. Electronically Signed   By: Aram Candela M.D.   On: 04/02/2023 18:31   CT HEAD WO CONTRAST ( )  Result Date: 04/01/2023 CLINICAL DATA:  Trauma EXAM: CT HEAD WITHOUT CONTRAST CT CERVICAL SPINE WITHOUT CONTRAST TECHNIQUE: Multidetector CT imaging of the head and cervical spine was performed following the standard protocol without intravenous contrast. Multiplanar CT image reconstructions of the cervical spine were also generated. RADIATION DOSE REDUCTION: This exam was performed according to the departmental dose-optimization program which includes automated exposure control, adjustment of the mA and/or kV according to patient  size and/or use of iterative reconstruction technique. COMPARISON:  04/23/2019 FINDINGS: CT HEAD FINDINGS Brain: No evidence of acute infarction, hemorrhage, hydrocephalus,  extra-axial collection or mass lesion/mass effect. Subcortical white matter and periventricular small vessel ischemic changes. Vascular: Intracranial atherosclerosis. Skull: Normal. Negative for fracture or focal lesion. Sinuses/Orbits: The visualized paranasal sinuses are essentially clear. The mastoid air cells are unopacified. Other: None. CT CERVICAL SPINE FINDINGS Alignment: Normal cervical lordosis. Skull base and vertebrae: No acute fracture. No primary bone lesion or focal pathologic process. Soft tissues and spinal canal: No prevertebral fluid or swelling. No visible canal hematoma. Disc levels: Mild degenerative changes of the mid cervical spine. Spinal canal is patent. Upper chest: Visualized lung apices are clear. Other: Visualized thyroid is unremarkable. IMPRESSION: No acute intracranial abnormality. Small vessel ischemic changes. No traumatic injury to the cervical spine. Mild degenerative changes. Electronically Signed   By: Charline Bills M.D.   On: 04/01/2023 12:11   CT Cervical Spine Wo Contrast  Result Date: 04/01/2023 CLINICAL DATA:  Trauma EXAM: CT HEAD WITHOUT CONTRAST CT CERVICAL SPINE WITHOUT CONTRAST TECHNIQUE: Multidetector CT imaging of the head and cervical spine was performed following the standard protocol without intravenous contrast. Multiplanar CT image reconstructions of the cervical spine were also generated. RADIATION DOSE REDUCTION: This exam was performed according to the departmental dose-optimization program which includes automated exposure control, adjustment of the mA and/or kV according to patient size and/or use of iterative reconstruction technique. COMPARISON:  04/23/2019 FINDINGS: CT HEAD FINDINGS Brain: No evidence of acute infarction, hemorrhage, hydrocephalus, extra-axial collection or mass lesion/mass effect. Subcortical white matter and periventricular small vessel ischemic changes. Vascular: Intracranial atherosclerosis. Skull: Normal. Negative for  fracture or focal lesion. Sinuses/Orbits: The visualized paranasal sinuses are essentially clear. The mastoid air cells are unopacified. Other: None. CT CERVICAL SPINE FINDINGS Alignment: Normal cervical lordosis. Skull base and vertebrae: No acute fracture. No primary bone lesion or focal pathologic process. Soft tissues and spinal canal: No prevertebral fluid or swelling. No visible canal hematoma. Disc levels: Mild degenerative changes of the mid cervical spine. Spinal canal is patent. Upper chest: Visualized lung apices are clear. Other: Visualized thyroid is unremarkable. IMPRESSION: No acute intracranial abnormality. Small vessel ischemic changes. No traumatic injury to the cervical spine. Mild degenerative changes. Electronically Signed   By: Charline Bills M.D.   On: 04/01/2023 12:11   DG Chest Portable 1 View  Result Date: 04/01/2023 CLINICAL DATA:  Shortness of breath. EXAM: PORTABLE CHEST 1 VIEW COMPARISON:  None Available. FINDINGS: Stable cardiomegaly is noted with mild central pulmonary vascular congestion. Minimal bibasilar subsegmental atelectasis or edema is noted. Bony thorax is unremarkable. IMPRESSION: Stable cardiomegaly with mild central pulmonary vascular congestion. Minimal bibasilar subsegmental atelectasis or edema is noted. Electronically Signed   By: Lupita Raider M.D.   On: 04/01/2023 11:00     Assessment and Plan:   Acute on chronic HFpEF -Longstanding history of HFpEF -Last echocardiogram done 10/2019 revealed LVEF greater than 55% done at [facility] by primary cardiologist -BNP 566 -She is not considered a good candidate for SGLT2 inhibitors due to history of recurrent UTIs -Would not recommend starting MRA with history of CKD and current AKI -Appears euvolemic on exam -Oxygen at 2 L O2 via nasal cannula -IV furosemide changed to as needed for recurrent shortness of breath -Updated echocardiogram ordered and pending with further recommendations to follow -Daily weight,  symptoms, low-sodium diet -Continue with heart failure education  AKI on CKD  -Significant anemia and 2.97  on admission -Baseline serum creatinine 1.3-1.6 -Received gentle hydration patient is +3 L -No accurate urine output has been recorded since admission -Daily BMP -Monitor urine output -Renal ultrasound reveals no obstruction -Monitor/trend/replete electrolytes as needed -Avoid nephrotoxic agents were able -Home irbesartan remains on hold  Anemia of chronic disease -Hemoglobin ranged from 9.4-11.5 over the last 6 months -Had dropped to 7.9  -Currently 8.5  -Could be a component of hemodilution from IV hydration -No active signs of bleeding -Daily CBC -Also could be a component causing her shortness of breath  Hypertension -Blood pressure 156/53 -Continued on amlodipine, hydralazine, and Toprol-XL -Furosemide placed on hold until after echocardiogram unless patient complains of worsening shortness of breath -Additional 5 mg of amlodipine for systolic blood pressure greater than 170 mmHg can consider changing amlodipine to 5 mg twice daily in place of irbesartan tomorrow -Vital signs per unit protocol  Hypothyroidism -Continued on levothyroxine -Management per IM  Recurrent UTI -Patient previously was on Keflex and changed to Bactrim -Urinalysis repeated during this hospitalization shows no bacteria or nitrates -Continue management per IM    Risk Assessment/Risk Scores:        New York Heart Association (NYHA) Functional Class NYHA Class III        For questions or updates, please contact [facility] Please consult [email or web address] for contact info under    Signed, Jeovany Huitron, NP  04/04/2023 3:47 PM

## 2023-04-05 ENCOUNTER — Inpatient Hospital Stay (HOSPITAL_COMMUNITY)
Admit: 2023-04-05 | Discharge: 2023-04-05 | Disposition: A | Discharge status: Home / Self Care | Payer: Medicare PPO | Attending: Cardiovascular Disease | Admitting: Cardiovascular Disease

## 2023-04-05 DIAGNOSIS — I5033 Acute on chronic diastolic (congestive) heart failure: Secondary | ICD-10-CM | POA: Diagnosis not present

## 2023-04-05 DIAGNOSIS — R531 Weakness: Secondary | ICD-10-CM | POA: Diagnosis not present

## 2023-04-05 LAB — ECHOCARDIOGRAM COMPLETE
AR max vel: 2.43 cm2
AV Area VTI: 2.55 cm2
AV Area mean vel: 2.76 cm2
AV Mean grad: 5 mmHg
AV Peak grad: 11.8 mmHg
Ao pk vel: 1.72 m/s
Area-P 1/2: 2.75 cm2
Height: 63 in
MV VTI: 2.53 cm2
S' Lateral: 2.6 cm
Weight: 2292.78 [oz_av]

## 2023-04-05 LAB — BASIC METABOLIC PANEL
Anion gap: 13 (ref 5–15)
BUN: 25 mg/dL — ABNORMAL HIGH (ref 8–23)
CO2: 21 mmol/L — ABNORMAL LOW (ref 22–32)
Calcium: 8.5 mg/dL — ABNORMAL LOW (ref 8.9–10.3)
Chloride: 105 mmol/L (ref 98–111)
Creatinine, Ser: 1.35 mg/dL — ABNORMAL HIGH (ref 0.44–1.00)
GFR, Estimated: 36 mL/min — ABNORMAL LOW (ref >60)
Glucose, Bld: 90 mg/dL (ref 70–99)
Potassium: 4.7 mmol/L (ref 3.5–5.1)
Sodium: 136 mmol/L (ref 135–145)

## 2023-04-05 LAB — MAGNESIUM: Magnesium: 1.9 mg/dL (ref 1.7–2.4)

## 2023-04-05 LAB — CBC
HCT: 26.4 % — ABNORMAL LOW (ref 36.0–46.0)
Hemoglobin: 8.6 g/dL — ABNORMAL LOW (ref 12.0–15.0)
MCH: 28.8 pg (ref 26.0–34.0)
MCHC: 32.6 g/dL (ref 30.0–36.0)
MCV: 88.3 fL (ref 80.0–100.0)
Platelets: 203 10*3/uL (ref 150–400)
RBC: 2.99 MIL/uL — ABNORMAL LOW (ref 3.87–5.11)
RDW: 15.5 % (ref 11.5–15.5)
WBC: 6.9 10*3/uL (ref 4.0–10.5)
nRBC: 0 % (ref 0.0–0.2)

## 2023-04-05 LAB — BRAIN NATRIURETIC PEPTIDE: B Natriuretic Peptide: 543.8 pg/mL — ABNORMAL HIGH (ref 0.0–100.0)

## 2023-04-05 LAB — PHOSPHORUS: Phosphorus: 3 mg/dL (ref 2.5–4.6)

## 2023-04-05 MED ORDER — IRBESARTAN 75 MG PO TABS: 75.0000 mg | ORAL_TABLET | Freq: Every day | ORAL | 5 refills | Status: AC

## 2023-04-05 MED ORDER — CYANOCOBALAMIN 1000 MCG PO TABS: 1000.0000 ug | ORAL_TABLET | Freq: Every day | ORAL | 2 refills | Status: AC

## 2023-04-05 MED ORDER — SODIUM BICARBONATE 650 MG PO TABS: 650.0000 mg | ORAL_TABLET | Freq: Two times a day (BID) | ORAL | 0 refills | Status: AC

## 2023-04-05 MED ORDER — POLYSACCHARIDE IRON COMPLEX 150 MG PO CAPS: 150.0000 mg | ORAL_CAPSULE | Freq: Every day | ORAL | 2 refills | Status: AC

## 2023-04-05 MED ORDER — ASCORBIC ACID 500 MG PO TABS: 500.0000 mg | ORAL_TABLET | Freq: Every day | ORAL | 2 refills | Status: AC

## 2023-04-05 MED ORDER — SODIUM BICARBONATE 650 MG PO TABS: 650.0000 mg | ORAL_TABLET | Freq: Two times a day (BID) | ORAL | Status: DC

## 2023-04-05 NOTE — Progress Notes (Signed)
Mobility Specialist - Progress Note   04/05/23 1100  Mobility  Activity Stood at bedside;Transferred to/from [facility]  Level of Assistance Contact guard assist, steadying assist  Assistive Device Front wheel walker (Simultaneous filing. User may not have seen previous data.)  Distance Ambulated (ft) 2 ft  Activity Response Tolerated well  $Mobility charge 1 Mobility     Pt lying in bed upon arrival, utilizing RA. Pt completed bed mobility and STS with minA. Pt voiced need to have BM. Transferred to [facility] for large BM. Assist for peri-care. Pt declined further activity at this time for echo. Pt left in bed with alarm set, needs in reach.   Filiberto Pinks Mobility Specialist 04/05/23, 11:23 AM

## 2023-04-05 NOTE — Progress Notes (Signed)
*  PRELIMINARY RESULTS* Echocardiogram 2D Echocardiogram has been performed.  Carolyne Fiscal 04/05/2023, 12:54 PM

## 2023-04-05 NOTE — Discharge Summary (Signed)
[facility] Discharge Summary   Patient: Julie Potts JYN:[id number]  PCP: Damaris Schooner, DO  Date of admission: 04/01/2023   Date of discharge:  04/05/2023     Discharge Diagnoses:  Principal Problem:   Weakness Active Problems:   Acute kidney injury superimposed on chronic kidney disease (HCC)   Hyponatremia   (HFpEF) heart failure with preserved ejection fraction (HCC)   Recurrent UTI (urinary tract infection)   Essential (primary) hypertension   Anemia of chronic disease   Acute renal failure with acute tubular necrosis superimposed on stage 4 chronic kidney disease (HCC)   Fall   Admitted From: Home Disposition:  Home with [facility]  Recommendations for Outpatient Follow-up:  Follow-up with PCP in 1 week, repeat BMP after 1 week to check renal function Monitor BP at home and follow with PCP to titrate medications accordingly.  Decreased to irbesartan from 300 to 75 mg p.o. daily, changed Lasix 20 mg p.o. daily or twice daily as needed for shortness of breath, lower extremity edema or weight gain.  Prescribed sodium bicarbonate 650 mg p.o. twice daily for 7 days. Continue iron with vitamin C and B12 supplement for 3 months. Follow up LABS/TEST:  BMP in 1 wk   Diet recommendation: Renal diet  Activity: The patient is advised to gradually reintroduce usual activities, as tolerated  Discharge Condition: stable  Code Status: DNR   History of present illness: As per the H and P dictated on admission Hospital Course:  Julie Potts is a 89 y.o. female with medical history significant of HFpEF, CKD stage IV, hypertension, hypothyroidism, recurrent UTI, who presents to the ED due to fall.  Patient says that her legs just gave out and fell down no other left side of face and lower ribs were hurting when she moves.  Patient had recurrent UTI, recently started on Bactrim 5 days ago.  No any other complaints at this time. ED workup, hypertension BP elevated, Hyponatremia sodium 128,  AKI creatinine 2.67, metabolic acidosis bicarb 19, BNP 566 elevated, anemia hemoglobin 9.2 UA trace LE, nitrate negative, WBC 11-20, few bacteria.  Not very impressive. CT head and C-spine negative for any acute findings CXR: Stable cardiomegaly with mild central pulmonary vascular congestion. Minimal bibasilar subsegmental atelectasis or edema is noted.   Assessment and Plan: # AKI on CKD stage IIIb Baseline creatinine 1.26, Creatinine 2.67 on admission, Held Lasix and irbesartan  S/p gentle IV fluid for hydration, DC'd on 8/29 due to DOE Avoided nephrotoxic medications, used renally dose medications Renal US ruled out obstruction. bladder scan showed intermittent urinary retention.  Patient refused Foley catheter insertion. Cr 2.67---1.35 gradually improving.  Repeat BMP after 1 week and follow with PCP and nephrology as an outpatient. # Acute hypoxic respiratory failure developed on 8/29 HFpEF, BNP elevated 566--543, S/p IV fluid gentle hydration given for AKI Held Lasix and irbesartan due to AKI. On 8/29 DC'd IVF c/o dyspnea on exertion, O2 sat dropped, supplemental oxygen given. CXR: 1. Improving central pulmonary vascular congestion. 2. Persistent left lower lobe atelectasis and/or consolidation with pleural effusion. On 8/29 Lasix 20 mg IV one-time dose given.  O2 saturation improved, currently saturating well on room air.  Dyspnea on exertion resolved.  Cardiology was consulted, recommended to change Lasix 20 mg p.o. daily R twice daily as needed for shortness of breath, edema or weight gain.  Decreased eye irbesartan from 300 to 75 mg p.o. daily.  TTE report is pending but verbal report got from cardio that EF is  normal, mild diastolic dysfunction, degenerative valvular changes but nothing significant.  Follow-up with cardiology as an outpatient. # Metabolic acidosis, Mild Co 20--21, s/p oral bicarbonate.  Sodium bicarbonate 650 mg p.o. twice daily for 7 days.  Repeat BMP after 1 week as an  outpatient. # Isotonic hyponatremia: Serum osmolality 292 within normal range. Hyponatremia most likely nutritional deficiency.  Na 136, sodium level improved.  Hyponatremia resolved. # Recurrent UTI, patient is recently treated with Bactrim, presented with AKI UA is not very impressive, but patient's daughter is concerned about recurrent UTI. On 8/27 started ceftriaxone 1 g IV daily x 3 days.  No more need of antibiotics.  Recommend to follow-up with urology as an outpatient if recurrent UTI, it is possible that patient may have urinary retention and may need Foley catheter. # Anemia of chronic disease Hemoglobin 8.6, remained stable. Transferrin saturation 16%, at lower end, folate 10.9 WNL, vitamin B12 300 at lower end. Continue oral iron supplement # Hypertension, Continue amlodipine and hydralazine. Resumed metoprolol 50 mg p.o. daily home dose on 8/29. Held Lasix and irbesartan due to AKI.  Decreased dose of irbesartan 75 mg p.o. daily on discharge and changed Lasix to as needed as per cardio recommendation.  Monitor BP at home and follow with PCP and cardiology to titrate medication accordingly. # Hypothyroid on Synthroid # Generalized weakness, multifactorial, could be due to AKI and hyponatremia and anemia of chronic disease. Continue fall precautions and supportive care.  Replete electrolytes and multivitamins. # Vitamin B12 level 300, goal >400, started vitamin B12 1000 mcg IM injection daily x 3 doses followed by oral supplement on discharge. # COPD, stable, continue home inhaler   Body mass index is 25.38 kg/m.  Nutrition Interventions:  Patient was seen by physical therapy, who recommended Home health, which was arranged. On the day of the discharge the patient's vitals were stable, and no other acute medical condition were reported by patient. the patient was felt safe to be discharge at Home with Home health.  Consultants: Cardiologist Procedures: None  Discharge  Exam: General: Appear in no distress, no Rash; Oral Mucosa Clear, moist. Cardiovascular: S1 and S2 Present, no Murmur, Respiratory: normal respiratory effort, Bilateral Air entry present and no Crackles, no wheezes Abdomen: Bowel Sound present, Soft and no tenderness, no hernia Extremities: no Pedal edema, no calf tenderness Neurology: alert and oriented to time, place, and person affect appropriate.  Filed Weights   04/01/23 1021 04/02/23 0500  Weight: 64.9 kg 65 kg   Vitals:   04/05/23 0700 04/05/23 0900  BP:  (!) 166/49  Pulse:  77  Resp:  18  Temp:  97.8 F (36.6 C)  SpO2: 98% 95%    DISCHARGE MEDICATION: Allergies as of 04/05/2023       Reactions   Ciprofloxacin Nausea Only   Levofloxacin Other (See Comments)   Weakness, "could not use legs"   Sulfa Antibiotics Rash        Medication List     STOP taking these medications    nitrofurantoin 50 MG capsule Commonly known as: MACRODANTIN   ondansetron 4 MG disintegrating tablet Commonly known as: Zofran ODT       TAKE these medications    albuterol 108 (90 Base) MCG/ACT inhaler Commonly known as: VENTOLIN HFA Inhale 2 puffs into the lungs every 6 (six) hours as needed.   amLODipine 5 MG tablet Commonly known as: NORVASC Take 1 tablet by mouth daily.   ascorbic acid 500 MG tablet Commonly known  as: VITAMIN C Take 1 tablet (500 mg total) by mouth daily. Start taking on: April 06, 2023   cyanocobalamin 1000 MCG tablet Take 1 tablet (1,000 mcg total) by mouth daily. Start taking on: April 06, 2023   Fluticasone Propionate Diskus 250 MCG/ACT Aepb Take 1 puff by mouth daily.   furosemide 20 MG tablet Commonly known as: LASIX Take 1 tablet (20 mg total) by mouth 2 (two) times daily as needed. For shortness of breath or edema and weight gain What changed:  when to take this reasons to take this additional instructions   gabapentin 100 MG capsule Commonly known as: NEURONTIN Take 200 mg by  mouth daily.   guaiFENesin 100 MG/5ML Soln Commonly known as: ROBITUSSIN Take 5 mLs (100 mg total) by mouth every 4 (four) hours as needed for cough or to loosen phlegm.   hydrALAZINE 50 MG tablet Commonly known as: APRESOLINE Take 1 tablet by mouth 3 (three) times daily.   HYDROcodone-acetaminophen 5-325 MG tablet Commonly known as: NORCO/VICODIN Take 1 tablet by mouth 2 (two) times daily as needed.   irbesartan 75 MG tablet Commonly known as: AVAPRO Take 1 tablet (75 mg total) by mouth daily. What changed:  medication strength how much to take   iron polysaccharides 150 MG capsule Commonly known as: NIFEREX Take 1 capsule (150 mg total) by mouth daily. Start taking on: April 06, 2023   levothyroxine 50 MCG tablet Commonly known as: SYNTHROID Take 50 mcg by mouth every morning.   metoprolol succinate 50 MG 24 hr tablet Commonly known as: TOPROL-XL Take 50 mg by mouth daily.   montelukast 10 MG tablet Commonly known as: SINGULAIR Take 10 mg by mouth daily.   sodium bicarbonate 650 MG tablet Take 1 tablet (650 mg total) by mouth 2 (two) times daily for 7 days.       Allergies  Allergen Reactions   Ciprofloxacin Nausea Only   Levofloxacin Other (See Comments)    Weakness, "could not use legs"   Sulfa Antibiotics Rash   Discharge Instructions     Call MD for:  difficulty breathing, headache or visual disturbances   Complete by: As directed    Call MD for:  extreme fatigue   Complete by: As directed    Call MD for:  persistant dizziness or light-headedness   Complete by: As directed    Call MD for:  persistant nausea and vomiting   Complete by: As directed    Call MD for:  severe uncontrolled pain   Complete by: As directed    Call MD for:  temperature >100.4   Complete by: As directed    Diet - low sodium heart healthy   Complete by: As directed    Discharge instructions   Complete by: As directed    Follow-up with PCP in 1 week, repeat BMP after 1  week to check renal function Monitor BP at home and follow with PCP to titrate medications accordingly.  Decreased to irbesartan from 300 to 75 mg p.o. daily, changed Lasix 20 mg p.o. daily or twice daily as needed for shortness of breath, lower extremity edema or weight gain.  Prescribed sodium bicarbonate 650 mg p.o. twice daily for 7 days. Continue iron with vitamin C and B12 supplement for 3 months.   Increase activity slowly   Complete by: As directed        The results of significant diagnostics from this hospitalization (including imaging, microbiology, ancillary and laboratory) are listed below for  reference.    Significant Diagnostic Studies: DG Chest Port 1 View  Result Date: 04/04/2023 CLINICAL DATA:  Shortness of breath. EXAM: PORTABLE CHEST 1 VIEW COMPARISON:  04/01/2023. FINDINGS: Central pulmonary vascular congestion, improved since the prior study. No frank pulmonary edema. Redemonstration of left retrocardiac airspace opacity obscuring the left hemidiaphragm, descending thoracic aorta and blunting the left lateral costophrenic angle suggesting combination of left lower lobe atelectasis and/or consolidation with pleural effusion. Bilateral lung fields are otherwise clear. No dense consolidation or major lung collapse. Right lateral costophrenic angle is clear. Stable mildly enlarged cardio-mediastinal silhouette. No acute osseous abnormalities. The soft tissues are within normal limits. IMPRESSION: 1. Improving central pulmonary vascular congestion. 2. Persistent left lower lobe atelectasis and/or consolidation with pleural effusion. Electronically Signed   By: Jules Schick M.D.   On: 04/04/2023 13:55   US RENAL  Result Date: 04/02/2023 CLINICAL DATA:  Acute kidney injury. EXAM: RENAL / URINARY TRACT ULTRASOUND COMPLETE COMPARISON:  None Available. FINDINGS: Right Kidney: Renal measurements: 9.3 cm x 4.6 cm x 5.0 cm = volume: 112.03 mL. Echogenicity within normal limits. No mass  or hydronephrosis visualized. Left Kidney: Renal measurements: 8.3 cm x 4.2 cm x 4.4 cm = volume: 80.05 mL. Diffusely increased echogenicity of the left kidney is noted. A 2.1 cm x 2.3 cm x 2.1 cm simple cyst is seen within the upper pole of the left kidney. No hydronephrosis is visualized. Bladder: Appears normal for degree of bladder distention. Other: None. IMPRESSION: 1. Echogenic left kidney which may be, in part, secondary to medical renal disease. 2. Simple left renal cyst. Electronically Signed   By: Aram Candela M.D.   On: 04/02/2023 18:31   CT HEAD WO CONTRAST ( )  Result Date: 04/01/2023 CLINICAL DATA:  Trauma EXAM: CT HEAD WITHOUT CONTRAST CT CERVICAL SPINE WITHOUT CONTRAST TECHNIQUE: Multidetector CT imaging of the head and cervical spine was performed following the standard protocol without intravenous contrast. Multiplanar CT image reconstructions of the cervical spine were also generated. RADIATION DOSE REDUCTION: This exam was performed according to the departmental dose-optimization program which includes automated exposure control, adjustment of the mA and/or kV according to patient size and/or use of iterative reconstruction technique. COMPARISON:  04/23/2019 FINDINGS: CT HEAD FINDINGS Brain: No evidence of acute infarction, hemorrhage, hydrocephalus, extra-axial collection or mass lesion/mass effect. Subcortical white matter and periventricular small vessel ischemic changes. Vascular: Intracranial atherosclerosis. Skull: Normal. Negative for fracture or focal lesion. Sinuses/Orbits: The visualized paranasal sinuses are essentially clear. The mastoid air cells are unopacified. Other: None. CT CERVICAL SPINE FINDINGS Alignment: Normal cervical lordosis. Skull base and vertebrae: No acute fracture. No primary bone lesion or focal pathologic process. Soft tissues and spinal canal: No prevertebral fluid or swelling. No visible canal hematoma. Disc levels: Mild degenerative changes of the  mid cervical spine. Spinal canal is patent. Upper chest: Visualized lung apices are clear. Other: Visualized thyroid is unremarkable. IMPRESSION: No acute intracranial abnormality. Small vessel ischemic changes. No traumatic injury to the cervical spine. Mild degenerative changes. Electronically Signed   By: Charline Bills M.D.   On: 04/01/2023 12:11   CT Cervical Spine Wo Contrast  Result Date: 04/01/2023 CLINICAL DATA:  Trauma EXAM: CT HEAD WITHOUT CONTRAST CT CERVICAL SPINE WITHOUT CONTRAST TECHNIQUE: Multidetector CT imaging of the head and cervical spine was performed following the standard protocol without intravenous contrast. Multiplanar CT image reconstructions of the cervical spine were also generated. RADIATION DOSE REDUCTION: This exam was performed according to the departmental dose-optimization program  which includes automated exposure control, adjustment of the mA and/or kV according to patient size and/or use of iterative reconstruction technique. COMPARISON:  04/23/2019 FINDINGS: CT HEAD FINDINGS Brain: No evidence of acute infarction, hemorrhage, hydrocephalus, extra-axial collection or mass lesion/mass effect. Subcortical white matter and periventricular small vessel ischemic changes. Vascular: Intracranial atherosclerosis. Skull: Normal. Negative for fracture or focal lesion. Sinuses/Orbits: The visualized paranasal sinuses are essentially clear. The mastoid air cells are unopacified. Other: None. CT CERVICAL SPINE FINDINGS Alignment: Normal cervical lordosis. Skull base and vertebrae: No acute fracture. No primary bone lesion or focal pathologic process. Soft tissues and spinal canal: No prevertebral fluid or swelling. No visible canal hematoma. Disc levels: Mild degenerative changes of the mid cervical spine. Spinal canal is patent. Upper chest: Visualized lung apices are clear. Other: Visualized thyroid is unremarkable. IMPRESSION: No acute intracranial abnormality. Small vessel  ischemic changes. No traumatic injury to the cervical spine. Mild degenerative changes. Electronically Signed   By: Charline Bills M.D.   On: 04/01/2023 12:11   DG Chest Portable 1 View  Result Date: 04/01/2023 CLINICAL DATA:  Shortness of breath. EXAM: PORTABLE CHEST 1 VIEW COMPARISON:  None Available. FINDINGS: Stable cardiomegaly is noted with mild central pulmonary vascular congestion. Minimal bibasilar subsegmental atelectasis or edema is noted. Bony thorax is unremarkable. IMPRESSION: Stable cardiomegaly with mild central pulmonary vascular congestion. Minimal bibasilar subsegmental atelectasis or edema is noted. Electronically Signed   By: Lupita Raider M.D.   On: 04/01/2023 11:00    Microbiology: No results found for this or any previous visit (from the past 240 hour(s)).   Labs: CBC: Recent Labs  Lab 04/01/23 1028 04/02/23 0422 04/03/23 0337 04/04/23 0351 04/05/23 0625  WBC 6.5 6.1 6.3 6.6 6.9  HGB 9.2* 7.9* 7.9* 8.5* 8.6*  HCT 28.7* 24.2* 24.4* 26.3* 26.4*  MCV 90.3 88.6 89.4 88.9 88.3  PLT 231 186 187 189 203   Basic Metabolic Panel: Recent Labs  Lab 04/01/23 1137 04/02/23 0422 04/03/23 0337 04/04/23 0351 04/05/23 0625  NA 128* 131* 133* 132* 136  K 4.7 4.4 4.2 4.5 4.7  CL 99 105 109 104 105  CO2 19* 19* 20* 20* 21*  GLUCOSE 85 75 85 91 90  BUN 54* 51* 41* 35* 25*  CREATININE 2.67* 2.47* 1.98* 1.74* 1.35*  CALCIUM 8.9 8.1* 7.5* 7.6* 8.5*  MG  --  2.0 2.2 2.0 1.9  PHOS  --  3.5 2.7 2.8 3.0   Liver Function Tests: No results for input(s): "AST", "ALT", "ALKPHOS", "BILITOT", "PROT", "ALBUMIN" in the last 168 hours. No results for input(s): "LIPASE", "AMYLASE" in the last 168 hours. No results for input(s): "AMMONIA" in the last 168 hours. Cardiac Enzymes: No results for input(s): "CKTOTAL", "CKMB", "CKMBINDEX", "TROPONINI" in the last 168 hours. BNP (last 3 results) Recent Labs    04/01/23 1109 04/05/23 0625  BNP 566.3* 543.8*   CBG: No results  for input(s): "GLUCAP" in the last 168 hours.  Time spent: 35 minutes  Signed:  Gillis Santa  [facility] 04/05/2023 3:25 PM

## 2023-04-05 NOTE — Progress Notes (Signed)
Occupational Therapy Treatment Patient Details Name: Julie Potts MRN: [id number] DOB: 06-20-1923 Today's Date: 04/05/2023   History of present illness Julie Potts is a 89 year old female with fall and generalized weakness.  History significant of HFpEF, CKD stage IV, hypertension, hypothyroidism, recurrent UTI.   OT comments  Pt sitting up in chair upon OT arrival and visiting with daughter.  Daughter reports pt had an "off" day yesterday, feeling weaker, but somewhat better today.  Pt was able to tolerate standing ADLs with close supv-min guard today in standing, but required seated rest breaks between each ADL.  Able to have a BM and urinate in toilet with 3in1 placed over top of commode which allowed toilet transfer with close supv.  Pt did require assist with peri care d/t messy BM.  In standing, pt reported she was too tired to remove her hands from RW to help with peri care.  Returned to recliner upon completion of ADLs noted below.  Daughter remained present and continues to plan to stay with pt in pt's home upon d/c from hospital.  Continue to recommend [facility] OT upon d/c.       If plan is discharge home, recommend the following:  A little help with walking and/or transfers;A lot of help with bathing/dressing/bathroom;Assistance with cooking/housework;Assist for transportation   Equipment Recommendations  None recommended by OT    Recommendations for Other Services      Precautions / Restrictions Precautions Precautions: Fall Restrictions Weight Bearing Restrictions: No       Mobility Bed Mobility               General bed mobility comments: NT today as pt was received and left in recliner Patient Response: Cooperative  Transfers   Equipment used: Rolling walker (2 wheels) Transfers: Sit to/from Stand Sit to Stand: Supervision                 Balance Overall balance assessment: History of Falls, Needs assistance Sitting-balance support: Feet  supported Sitting balance-Leahy Scale: Fair     Standing balance support: During functional activity, Reliant on assistive device for balance Standing balance-Leahy Scale: Fair Standing balance comment: leaned into forearms on sink countertop today for washing hands d/t fatigue following toileting ADL.  BUE support on walker in standing during peri care and clothing management following toileting.                           ADL either performed or assessed with clinical judgement   ADL Overall ADL's : Needs assistance/impaired     Grooming: Wash/dry hands;Oral care;Brushing hair;Supervision/safety;Set up;Sitting;Standing Grooming Details (indicate cue type and reason): able to stand at sink with supv to wash/dry hands.  Required set up of comb while seated EOB d/t fatigue following toileting.  Following a short seated rest break, pt tolerated standing at sink to brush teeth with supv.                 Toilet Transfer: Contact guard assist;BSC/3in1;Grab bars;Rolling walker (2 wheels) Toilet Transfer Details (indicate cue type and reason): 3in1 placed over toilet Toileting- Clothing Manipulation and Hygiene: Maximal assistance Toileting - Clothing Manipulation Details (indicate cue type and reason): Max A to perform thorough peri care d/t messy BM with residue on leg; mod A to hike diaper as pt reported she was too fatigued to remove her hands from RW     Functional mobility during ADLs: Contact guard assist;Rolling walker (2 wheels)  Extremity/Trunk Assessment Upper Extremity Assessment Upper Extremity Assessment: Overall WFL for tasks assessed   Lower Extremity Assessment Lower Extremity Assessment: Generalized weakness        Vision Patient Visual Report: No change from baseline     Perception     Praxis      Cognition Arousal: Alert Behavior During Therapy: WFL for tasks assessed/performed Overall Cognitive Status: Within Functional Limits for tasks  assessed                                 General Comments: patient is able to follow commands without difficulty        Exercises      Shoulder Instructions       General Comments      Pertinent Vitals/ Pain       Pain Assessment Pain Score: 9  Pain Location: back Pain Descriptors / Indicators: Discomfort, Grimacing Pain Intervention(s): Limited activity within patient's tolerance, Monitored during session, Repositioned, Other (comment) (nursing notified of pt's request for tylenol)  Home Living                                          Prior Functioning/Environment              Frequency  Min 1X/week        Progress Toward Goals  OT Goals(current goals can now be found in the care plan section)  Progress towards OT goals: Progressing toward goals  Acute Rehab OT Goals Patient Stated Goal: Home with daughter OT Goal Formulation: With patient/family Time For Goal Achievement: 04/16/23 Potential to Achieve Goals: Good  Plan      Co-evaluation                 AM-PAC OT "6 Clicks" Daily Activity     Outcome Measure     Help from another person taking care of personal grooming?: None Help from another person toileting, which includes using toliet, bedpan, or urinal?: A Lot Help from another person bathing (including washing, rinsing, drying)?: A Little Help from another person to put on and taking off regular upper body clothing?: None Help from another person to put on and taking off regular lower body clothing?: A Lot 6 Click Score: 15    End of Session Equipment Utilized During Treatment: Gait belt;Rolling walker (2 wheels)  OT Visit Diagnosis: Unsteadiness on feet (R26.81);Repeated falls (R29.6);Muscle weakness (generalized) (M62.81)   Activity Tolerance Patient tolerated treatment well   Patient Left in chair;with call bell/phone within reach;with family/visitor present   Nurse Communication           Time: 4098-1191 OT Time Calculation (min): 29 min  Charges: OT General Charges $OT Visit: 1 Visit OT Treatments $Self Care/Home Management : 23-37 mins  Danelle Earthly, MS, OTR/L   [facility] Dials 04/05/2023, 12:04 PM

## 2023-04-05 NOTE — Care Management Important Message (Signed)
Important Message  Patient Details  Name: Julie Potts MRN: [id number] Date of Birth: October 14, 1922   Medicare Important Message Given:  Yes     Verita Schneiders Gautham Hewins 04/05/2023, 12:45 PM

## 2023-04-05 NOTE — Progress Notes (Signed)
Physical Therapy Treatment Patient Details Name: Julie Potts MRN: [id number] DOB: Oct 29, 1922 Today's Date: 04/05/2023   History of Present Illness Julie Potts is a 89 year old female with fall and generalized weakness.  History significant of HFpEF, CKD stage IV, hypertension, hypothyroidism, recurrent UTI.    PT Comments  Pt in chair on arrival, reports Left rib pain to be bad today, worse from extended time in bed yesterday, just received tylenol, but timely not optimal as pain is still fairly limiting tolerated volume of AMB.  Pt continues to transfer and AMB at supervision to modI. Will continue to follow.    If plan is discharge home, recommend the following: A little help with walking and/or transfers;A little help with bathing/dressing/bathroom;Assistance with cooking/housework;Assist for transportation   Can travel by private vehicle        Equipment Recommendations  None recommended by PT    Recommendations for Other Services       Precautions / Restrictions Precautions Precautions: Fall Restrictions Weight Bearing Restrictions: No     Mobility  Bed Mobility               General bed mobility comments: in recliner    Transfers Overall transfer level: Needs assistance Equipment used: Rolling walker (2 wheels) Transfers: Sit to/from Stand Sit to Stand: Supervision                Ambulation/Gait Ambulation/Gait assistance: Supervision (hands on diaper to keep it up) Gait Distance (Feet): 150 Feet Assistive device: Rolling walker (2 wheels) Gait Pattern/deviations: WFL(Within Functional Limits)       General Gait Details: pain more limiting today, was in bed most of day yesterday which made her back pain worse.   Stairs             Wheelchair Mobility     Tilt Bed    Modified Rankin (Stroke Patients Only)       Balance                                            Cognition Arousal: Alert Behavior  During Therapy: WFL for tasks assessed/performed Overall Cognitive Status: Within Functional Limits for tasks assessed                                          Exercises      General Comments        Pertinent Vitals/Pain Pain Assessment Pain Assessment: Faces Faces Pain Scale: Hurts even more Pain Location: L posterior ribs Pain Descriptors / Indicators: Discomfort, Grimacing Pain Intervention(s): Limited activity within patient's tolerance    Home Living                          Prior Function            PT Goals (current goals can now be found in the care plan section) Acute Rehab PT Goals Patient Stated Goal: to go home PT Goal Formulation: With patient Time For Goal Achievement: 04/16/23 Potential to Achieve Goals: Good Progress towards PT goals: Progressing toward goals    Frequency    Min 1X/week      PT Plan      Co-evaluation  AM-PAC PT "6 Clicks" Mobility   Outcome Measure  Help needed turning from your back to your side while in a flat bed without using bedrails?: None Help needed moving from lying on your back to sitting on the side of a flat bed without using bedrails?: A Little Help needed moving to and from a bed to a chair (including a wheelchair)?: A Little Help needed standing up from a chair using your arms (e.g., wheelchair or bedside chair)?: A Little Help needed to walk in hospital room?: A Little Help needed climbing 3-5 steps with a railing? : A Little 6 Click Score: 19    End of Session Equipment Utilized During Treatment: Gait belt Activity Tolerance: Patient tolerated treatment well;No increased pain Patient left: with call bell/phone within reach;in chair;with family/visitor present Nurse Communication: Mobility status PT Visit Diagnosis: Unsteadiness on feet (R26.81);Muscle weakness (generalized) (M62.81)     Time: 6213-0865 PT Time Calculation (min) (ACUTE ONLY): 9  min  Charges:    $Therapeutic Activity: 8-22 mins PT General Charges $$ ACUTE PT VISIT: 1 Visit                    11:38 AM, 04/05/23 Rosamaria Lints, PT, DPT Physical Therapist - [facility]  [phone] (ASCOM)     Kassius Battiste C 04/05/2023, 11:36 AM

## 2023-04-05 NOTE — Progress Notes (Signed)
Patient  and daughter was given verbal and written discharged instruction, she acknowledge understanding and states she will comply.Patient to car by wheelchair, no distress noted when taken to car.

## 2023-04-05 NOTE — Progress Notes (Signed)
   Patient Name: Julie Potts Date of Encounter: 04/05/2023 [facility] Cardiologist: None  Follow with [facility]  Interval Summary  .    Patient seen on AM rounds. Denies any chest pain or shortness of breath. Blood pressures were more elevated overnight. Sitting up in the chair eating without difficulty.  Vital Signs .    Vitals:   04/04/23 1716 04/04/23 2024 04/04/23 2315 04/05/23 0700  BP: (!) 142/48  (!) 156/46   Pulse: 62  74   Resp: 18  17   Temp: 97.7 F (36.5 C)  97.8 F (36.6 C)   TempSrc:      SpO2: 98% 98% 96% 98%  Weight:      Height:        Intake/Output Summary (Last 24 hours) at 04/05/2023 0811 Last data filed at 04/04/2023 0900 Gross per 24 hour  Intake 240 ml  Output --  Net 240 ml      04/02/2023    5:00 AM 04/01/2023   10:21 AM 04/23/2019    9:13 AM  Last 3 Weights  Weight (lbs) 143 lb 4.8 oz 143 lb 145 lb  Weight (kg) 65 kg 64.864 kg 65.772 kg      Telemetry/ECG    No currently on telemetry - Personally Reviewed  Physical Exam .   GEN: No acute distress.   Neck: No JVD Cardiac: RRR, no murmurs, rubs, or gallops.  Respiratory: Clear to auscultation bilaterally.Respirations are unlabored at rest on room air GI: Soft, nontender, non-distended  MS: No edema  Assessment & Plan .     Acute on chronic HFpEF -Accurate I's and O's have not been recorded since admission -Echocardiogram ordered and pending with further recommendations to follow -Last echocardiogram done 10/2022 LVEF greater than 55%, [facility] by primary cardiology team -BNP 566 -She is not that a candidate for SGLT2 inhibitors due to history of recurrent UTIs -Not a candidate for MRA with history of CKD and current AKI this admission -Appears euvolemic on exam -Maintaining oxygen saturations comfortably on room air -Continued on as needed IV furosemide -Continue with heart failure education -Daily weights, I's and O's, low-sodium diet  AKI on CKD -Serum creatinine  1.35, continue to improve and is now back at baseline -Baseline serum creatinine 1.3-1.6 -Remains on 3 L -No accurate urine output has been recorded since admission only possible occurrences -Daily BMP -Monitor accurate urine output -Renal ultrasound reveals no obstruction -Monitor/trend/replete electrolytes as needed -Avoid nephrotoxic agents were able -Previous dose of home irbesartan remains on hold  Anemia of chronic disease -Hemoglobin 8.6 -No active signs of bleeding -Daily CBC  Hypertension -Blood pressure 166/49 -Continued on amlodipine 5 mg daily, hydralazine 50 mg 3 times a day, and Toprol-XL 50 mg daily -Vital signs reviewed protocol  Hypothyroidism -Continued on levothyroxine  Recurrent UTI -Treated with Keflex that was changed to Bactrim -Urinalysis this hospitalization shows no bacteria or nitrites -Continued management per IM  For questions or updates, please contact [facility] Please consult [email or web address] for contact info under        Signed, Lyam Provencio, NP

## 2023-08-30 ENCOUNTER — Other Ambulatory Visit: Payer: Self-pay | Admitting: Otolaryngology

## 2023-08-30 ENCOUNTER — Ambulatory Visit
Admission: RE | Admit: 2023-08-30 | Discharge: 2023-08-30 | Disposition: A | Discharge status: Home / Self Care | Payer: Medicare PPO | Source: Ambulatory Visit | Attending: Otolaryngology | Admitting: Otolaryngology

## 2023-08-30 DIAGNOSIS — R059 Cough, unspecified: Secondary | ICD-10-CM

## 2023-09-13 NOTE — ED Provider Notes (Signed)
 [facility] Emergency Department Provider Note   ED Clinical Impression   Final diagnoses:  Acute cystitis without hematuria (Primary)    Initial Impression/MDM, Assessment and Plan, ED Course,   Time seen: September 13, 2023 6:37 PM   89 y.o. female with a past medical history of HTN, HFpEF (LVEF 60-65% in August 2024), and hypothyroidism presenting for evaluation of around 2 months of a persistent cough, diarrhea, and new dysuria over the past few days.   On exam, the patient appears alert and in no acute distress. VS are within normal limits. Physical exam revealed a rhonchus cough. Lungs clear to auscultation bilaterally, with some upper airway referred sounds. Mild pitting edema to the lower extremities, slightly worse on the left. Abdomen soft and non-tender.   Impression & Plan: 89 yo with a cough for approximately 2 months, ongoing diarrhea, new dysuria, and lightheadedness yesterday. She did not have a fall, rather just sat down. Differential includes new pneumonia vs flu/RSV/COVID vs dehydration vs electrolyte abnormality vs UTI. Will check CXR, labs, and full RPP.  Patient's urine demonstrates UTI, which is consistent with her symptoms.  I will start her on Keflex.  Patient stable for discharge home at this time.  We have recommended to use some Imodium for diarrhea.         ____________________________________________    History   Chief Complaint Medical Problem   HPI  Julie Potts is a 89 y.o. female with a past medical history of HTN, HFpEF (LVEF 60-65% in August 2024), and hypothyroidism who presents to the [facility] Emergency Department for evaluation of cough and a possible UTI. The patient reports around 2 months of a persistent cough and non-bloody diarrhea. She was seen for her cough by her PCP in early December 2024 when her cough first started and was been treated for possible bronchitis with inhalers, azithromycin, and prednisone. She has also more recently had a  chest x-ray on 1/24 which showed possible trace pleural effusions, but no evidence of pneumonia  of pulmonary edema. Additionally, she expresses concern for a possible UTI in the setting of a few days of dysuria. Of note, her daughter at bedside also reports that she fell yesterday, landing on her buttocks, however the patient clarifies that she did not actually fall, but felt lightheaded and sat down. No head strike or LOC.    Physical Exam   VITAL SIGNS:   ED Triage Vitals  Enc Vitals Group     BP 09/13/23 1202 145/50     Heart Rate 09/13/23 1202 81     SpO2 Pulse --      Resp 09/13/23 1202 16     Temp 09/13/23 1202 36.7 C (98 F)     Temp Source 09/13/23 1537 Temporal     SpO2 09/13/23 1202 95 %     Weight 09/13/23 1202 61.2 kg (135 lb)    Constitutional: Alert and oriented. Well appearing and in no distress. Eyes: Conjunctivae are normal.  ENT      Head: Normocephalic and atraumatic.      Nose: No congestion.      Mouth/Throat: Mucous membranes are moist.      Neck: No stridor. Cardiovascular: Normal rate, regular rhythm. Respiratory: Normal respiratory effort.Some upper airway referred sounds. Rhonchus cough.  Gastrointestinal: Soft, non tender, non distend, without rebound or guarding. Musculoskeletal: No deformities. Mild pitting edema to the lower extremities, slightly worse on the left. Neurologic: Normal speech and language. No gross focal neurologic deficits are  appreciated. Skin: Skin is warm, dry and intact. No rash noted.   --------------------------------  Past Medical History Past Medical History:  Diagnosis Date  . HTN (hypertension)   . Hypothyroidism, unspecified   . Indigestion     Past Surgical History Past Surgical History:  Procedure Laterality Date  . APPENDECTOMY    . CATARACT EXTRACTION, BILATERAL    . CHOLECYSTECTOMY    . KNEE ARTHROSCOPY    . PARTIAL HIP ARTHROPLASTY Left   . THYROIDECTOMY    . TONSILLECTOMY AND ADENOIDECTOMY       Medications No current facility-administered medications for this encounter.  Current Outpatient Medications:  .  acetaminophen  (TYLENOL  EXTRA STRENGTH) 500 MG tablet, Take 2 tablets (1,000 mg total) by mouth two (2) times a day., Disp: , Rfl:  .  albuterol  HFA 90 mcg/actuation inhaler, Inhale 2 puffs every four (4) hours as needed., Disp: , Rfl:  .  amlodipine  (NORVASC ) 5 MG tablet, Take 1 tablet (5 mg total) by mouth daily., Disp: 90 tablet, Rfl: 3 .  ascorbic acid , vitamin C , (VITAMIN C ) 1000 MG tablet, Take 1 tablet (1,000 mg total) by mouth daily., Disp: , Rfl:  .  benzonatate (TESSALON) 200 MG capsule, Take 1 capsule (200 mg total) by mouth Three (3) times a day as needed for cough., Disp: 21 capsule, Rfl: 1 .  cholecalciferol, vitamin D3-50 mcg, 2,000 unit,, 50 mcg (2,000 unit) tablet, Take 1 tablet (50 mcg total) by mouth daily., Disp: , Rfl:  .  dextromethorphan-guaiFENesin  60-1,200 mg Tb12, Take 1 tablet by mouth every twelve (12) hours., Disp: , Rfl:  .  fluticasone  propionate (FLOVENT  DISKUS) 250 mcg/actuation DsDv diskus, Inhale 1 puff nightly., Disp: , Rfl:  .  furosemide  (LASIX ) 20 MG tablet, Take 1 tablet (20 mg total) by mouth daily. Take extra dose as needed for wt gain or swelling, Disp: 100 tablet, Rfl: 3 .  gabapentin  (NEURONTIN ) 100 MG capsule, Take 2 capsules (200 mg total) by mouth nightly., Disp: , Rfl:  .  hydrALAZINE  (APRESOLINE ) 50 MG tablet, TAKE ONE TABLET BY MOUTH THREE TIMES DAILY, Disp: 270 tablet, Rfl: 2 .  HYDROcodone -acetaminophen  (NORCO) 5-325 mg per tablet, Take 1 tablet by mouth two (2) times a day as needed., Disp: , Rfl:  .  irbesartan  (AVAPRO ) 300 MG tablet, Take 0.5 tablets (150 mg total) by mouth daily., Disp: 90 tablet, Rfl: 3 .  levothyroxine  (SYNTHROID ) 50 MCG tablet, Take 1 tablet (50 mcg total) by mouth daily., Disp: 30 tablet, Rfl: 0 .  levothyroxine  (SYNTHROID ) 75 MCG tablet, Take 1 tablet (75 mcg total) by mouth once a week. MONDAYS, Disp: ,  Rfl:  .  montelukast  (SINGULAIR ) 10 mg tablet, Take 1 tablet (10 mg total) by mouth nightly., Disp: 30 tablet, Rfl: 24 .  peg 400-propylene glycol, PF, 0.4-0.3 % Dpet, Inject 1 drop into the eye daily as needed., Disp: , Rfl:   Allergies Sulfa (sulfonamide antibiotics), Amoxicillin-pot clavulanate, Other, Ciprofloxacin, Doxycycline, Levofloxacin, and Sulfasalazine  Family History Family History  Problem Relation Age of Onset  . No Known Problems Mother   . Heart disease Father     Social History Social History   Tobacco Use  . Smoking status: Never    Passive exposure: Never  . Smokeless tobacco: Never  Vaping Use  . Vaping status: Never Used  Substance Use Topics  . Alcohol use: No   Social Drivers of Health with Concerns   Internet Connectivity: Not on file  Housing/Utilities: Not on file  Alcohol Use: Not on file  Interpersonal Safety: Not on file  Physical Activity: Not on file  Stress: Not on file  Substance Use: Not on file (06/11/2023)  Social Connections: Not on file  Health Literacy: Not on file    Review of Systems:  Pertinent positives and negatives are documented as per the HPI    EKG  See ED Course above   Radiology   XR Chest 2 views    (Results Pending)    Labs   Labs Reviewed  COMPREHENSIVE METABOLIC PANEL - Abnormal; Notable for the following components:      Result Value   BUN 25 (*)    Creatinine 1.26 (*)    eGFR CKD-EPI (2021) Female 39 (*)    Albumin 3.0 (*)    All other components within normal limits  LIPASE - Abnormal; Notable for the following components:   Lipase 58 (*)    All other components within normal limits  CBC W/ AUTO DIFF - Abnormal; Notable for the following components:   RBC 3.60 (*)    HGB 10.6 (*)    HCT 32.6 (*)    All other components within normal limits  INFLUENZA/RSV/COVID PCR - Normal   Narrative:    This test was performed using the Cepheid Xpert Xpress SARS-CoV-2/Flu/RSV plus assay, which has been  validated by the CLIA-certified, CAP-inspected [facility]. FDA has granted Emergency Use Authorization for this test. Negative results do not preclude infection and should be interpreted along with clinical observations, patient history, and epidemiological information. Information for providers and patients can be found here: [email or web address]  CBC W/ DIFFERENTIAL   Narrative:    The following orders were created for panel order CBC w/ Differential.                 Procedure                               Abnormality         Status                                    ---------                               -----------         ------                                    CBC w/ Differential[[id number]]         Abnormal            Final result                                               Please view results for these tests on the individual orders.  URINALYSIS WITH MICROSCOPY WITH CULTURE REFLEX   Narrative:    The following orders were created for panel order Urinalysis with Microscopy with Culture Reflex.                 Procedure  Abnormality         Status                                    ---------                               -----------         ------                                    Urinalysis with Microsc.SABRASABRA[[id number]]                                                                                 Please view results for these tests on the individual orders.  URINALYSIS WITH MICROSCOPY WITH CULTURE REFLEX PERFORMABLE    Pertinent labs & imaging results that were available during my care of the patient were reviewed by me and considered in my medical decision making (see chart for details).  Documentation assistance was provided by Almarie Meager, Scribe on September 13, 2023 at 6:37 PM for Eva Senters, DO.  A scribe was used when documenting this  visit. I agree with the above documentation. Signed by  Eva KANDICE Senters, DO on  September 14, 2023 at 7:48 PM       Senters Eva Eans, DO 09/14/23 [phone]

## 2024-03-10 NOTE — Discharge Summary (Signed)
 ------------------------------------------------------------------------------- Attestation signed by Julie Adrien Helling, MD at 03/11/24 1404 I saw and examined the patient on the day of discharge, participating in the key portions of the service on the day of discharge, I reviewed the resident's note and agree with the discharge plans and disposition.  I personally spent 25 minutes on the discharge of this patient.   -------------------------------------------------------------------------------   Physician Discharge Summary [facility] BT[address] [facility] [address] [place] [address] Dept: [phone] Loc: [phone]   Identifying Information:  Julie Potts 09-11-1922 [id number]  Primary Care Physician: Julie Nest, MD   Code Status: DNR and DNI  Admit Date: 03/05/2024  Discharge Date: 03/11/2024   Discharge To: SNF  Discharge Service: [facility] - Geriatrics Floor Team ([facility])   Discharge Attending Physician: Adrien Potts Dante, MD  Discharge Diagnoses:   Principal Problem:   Aspiration pneumonia   Active Problems:   Aspiration into lower respiratory tract   Asthma (HHS-HCC)   (HFpEF) heart failure with preserved ejection fraction      HTN (hypertension)    Hypothyroidism, unspecified    Small intestinal bacterial overgrowth    Diarrhea   Resolved Problems:   Aspiration PNA  Outpatient Provider Follow Up Issues:    For PCP: [ ]  Further work-up and management of chronic diarrhea (reported history of SIBO empirically treated with 14d of rifaximin); new initiation of Metamucil inpatient  Hospital Course:   Julie Potts is a 89 y.o. female with a PMHx of  HFpEF, hypertension, hypothyroidism, SIBO, OA, CKD who presented as a direct admission to [facility] for ongoing cough and weakness, found to have asthma exacerbation likely due to aspiration CAP and volume overload iso HFpEF.  Asthma Exacerbation due to CAP + Volume Overload Since RSV infection in 07/2023,  PT with chronic cough. Acutely presented with productive cough, shortness of breath, profound weakness and found to have diffuse wheezing, leukocytosis, CT findings c/f aspiration pneumonia and small bilateral pleural effusions. Suspect persistent aspiration since December, which has been worsening her bronchospasticity and predisposed her to infection. She was treated with CTX/cefdinir, azithromycin and 10 day course of prednisone for her CAP. She continued on her home asthma medications, nebulizers, airway clearance. Initially, patient required BiPAP for WOB, however improved after a short trial and no longer required. She was found to be clinically improved and discharged in stable condition with sodium chloride  3%, albuterol , budesonide , guaifenesin , singulair , and outstanding course of prednisone.  Volume overload  HFpEF  HTN  Patient presented with cough and shortness of breath. Hypertensive on admission. CXR with mild interstitial pulmonary edema with small left pleural effusion. 03/2023 TTE showed LVEF of 60-65%, Grade I DD. Home diuretic regimen: lasix  20mg  daily. Continued PRN diuresis with IV Lasix  and resumed her PO Lasix  20mg  daily on discharge.  Chronic Diarrhea  History of SIBO  Hypokalemia Chronic diarrhea for 6 months described as runny stools, incontinence, worse after eating. KUB with mildly dilated air-filled loops of presumed small bowel throughout abdomen c/w possible ileus or obstruction.  Patient has documented history of possible SIBO treatment that was apparently resolved after 14 days of empiric treatment with rifaximin.  C. difficile assay negative.  Added daily Metamucil for stool bulking and PRN pepto bismol.  Low PO Intake  Weight Loss  Hypokalemia 10 lb weight loss reported from December 2024 to present in 6 months (from 145 > 135 lbs). Reports fear of eating too much due to frequent diarrhea. Possibly contributing to hypokalemia. Potassium normalized. Consider  further  outpatient management/work-up of chronic diarrhea.   The patient's hospital stay has been complicated by the following clinically significant conditions requiring additional evaluation and treatment or having a significant effect of this patient's care:  - Age related debility POA requiring additional resources: DME, PT, or OT - Hypokalemia POA requiring further investigation, treatment, or monitoring      Touchbase with Outpatient Provider: Warm Handoff: Completed on 03/11/24 by Julie Mas, MD  (Intern) via [facility] Message  Procedures: None ______________________________________________________________________ Discharge Medications:    Your Medication List     PAUSE taking these medications    albuterol  90 mcg/actuation inhaler Wait to take this until your doctor or other care provider tells you to start again. Commonly known as: PROVENTIL  HFA;VENTOLIN  HFA Inhale 2 puffs every four (4) hours as needed. You also have another medication with the same name that you may need to continue taking.       STOP taking these medications    FLOVENT  DISKUS 250 mcg/actuation Dsdv diskus Generic drug: fluticasone  propionate   HYDROcodone -acetaminophen  5-325 mg per tablet Commonly known as: NORCO       START taking these medications    AEROCHAMBER MV inhaler Generic drug: inhalational spacing device 3 each by Miscellaneous route as needed.   arformoterol 15 mcg/2 mL Commonly known as: BROVANA Inhale 2 mL (15 mcg total) by nebulization in the morning and 2 mL (15 mcg total) in the evening.   bismuth subsalicylate 262 mg/15 mL suspension Commonly known as: PEPTO BISMOL Take 30 mL by mouth every six (6) hours as needed for indigestion.   budesonide  0.5 mg/2 mL nebulizer solution Commonly known as: PULMICORT  Inhale 2 mL (0.5 mg total) by nebulization in the morning and 2 mL (0.5 mg total) in the evening.   guaiFENesin  400 mg Tab Commonly known as: HUMIBID 3 Take 1 tablet  (400 mg total) by mouth every four (4) hours.   melatonin 3 mg Tab Take 1 tablet (3 mg total) by mouth nightly as needed.   predniSONE 20 MG tablet Commonly known as: DELTASONE Take 2 tablets (40 mg total) by mouth in the morning for 5 days.   psyllium 3.4 gram packet Commonly known as: METAMUCIL Take 1 packet by mouth daily.   sodium chloride  3 % NEBULIZER solution Inhale 4 mL by nebulization every four (4) hours as needed. Can use every four hours to thin out cough mucus if feeling congested       CHANGE how you take these medications    albuterol  2.5 mg /3 mL (0.083 %) nebulizer solution Inhale 3 mL (2.5 mg total) by nebulization every four (4) hours as needed for wheezing. What changed: when to take this       CONTINUE taking these medications    acetaminophen  500 MG tablet Commonly known as: TYLENOL  EXTRA STRENGTH Take 2 tablets (1,000 mg total) by mouth two (2) times a day.   amlodipine  5 MG tablet Commonly known as: NORVASC  Take 1 tablet (5 mg total) by mouth daily.   ascorbic acid  (vitamin C ) 1000 MG tablet Commonly known as: VITAMIN C  Take 1 tablet (1,000 mg total) by mouth daily.   benzonatate 100 MG capsule Commonly known as: TESSALON Take 1 capsule (100 mg total) by mouth Three (3) times a day as needed for cough.   cholecalciferol (vitamin D3-50 mcg (2,000 unit)) 50 mcg (2,000 unit) tablet Take 1 tablet (50 mcg total) by mouth daily.   furosemide  20 MG tablet Commonly known as:  LASIX  Take 1 tablet (20 mg total) by mouth daily. Take extra dose as needed for wt gain or swelling   gabapentin  100 MG capsule Commonly known as: NEURONTIN  Take 2 capsules (200 mg total) by mouth nightly.   hydrALAZINE  50 MG tablet Commonly known as: APRESOLINE  TAKE ONE TABLET BY MOUTH THREE TIMES DAILY   irbesartan  150 MG tablet Commonly known as: AVAPRO  Take 1 tablet (150 mg total) by mouth daily.   levothyroxine  75 MCG tablet Commonly known as: SYNTHROID  Take  1 tablet (75 mcg total) by mouth daily.   montelukast  10 mg tablet Commonly known as: SINGULAIR  Take 1 tablet (10 mg total) by mouth nightly.   peg 400-propylene glycol (PF) 0.4-0.3 % Dpet Inject 1 drop into the eye daily as needed.   PRESERVISION AREDS 2 ORAL Take 1 tablet by mouth two (2) times a day.        Allergies: Sulfa (sulfonamide antibiotics), Amoxicillin-pot clavulanate, Other, Ciprofloxacin, Doxycycline, Levofloxacin, and Sulfasalazine ______________________________________________________________________ Pending Test Results:   Most Recent Labs: All lab results last 24 hours -  Recent Results (from the past 24 hours)  CBC   Collection Time: 03/11/24  6:40 AM  Result Value Ref Range   WBC 8.0 3.6 - 11.2 10*9/L   RBC 3.45 (L) 3.95 - 5.13 10*12/L   HGB 9.9 (L) 11.3 - 14.9 g/dL   HCT 70.4 (L) 65.9 - 55.9 %   MCV 85.5 77.6 - 95.7 fL   MCH 28.8 25.9 - 32.4 pg   MCHC 33.7 32.0 - 36.0 g/dL   RDW 84.6 (H) 87.7 - 84.7 %   MPV 7.7 6.8 - 10.7 fL   Platelet 304 150 - 450 10*9/L  Basic Metabolic Panel   Collection Time: 03/11/24  6:40 AM  Result Value Ref Range   Sodium 143 135 - 145 mmol/L   Potassium 3.9 3.5 - 5.1 mmol/L   Chloride 103 98 - 107 mmol/L   CO2 26.1 20.0 - 31.0 mmol/L   Anion Gap 14 5 - 14 mmol/L   BUN 39 (H) 9 - 23 mg/dL   Creatinine 8.67 (H) 9.44 - 1.02 mg/dL   BUN/Creatinine Ratio 30    eGFR CKD-EPI (2021) Female 37 (L) >=60 mL/min/1.38m2   Glucose 85 70 - 179 mg/dL   Calcium 8.5 (L) 8.7 - 10.4 mg/dL  Magnesium Level   Collection Time: 03/11/24  6:40 AM  Result Value Ref Range   Magnesium 2.2 1.6 - 2.6 mg/dL    Relevant Studies/Radiology: PVL Venous Duplex Lower Extremity Bilateral Result Date: 03/10/2024  Peripheral Vascular Lab     [address]   [place], [place] [address]  PVL VENOUS DUPLEX LOWER EXTREMITY BILATERAL Patient Demographics Pt. Name: LIVI MCGANN Location: [facility] MRN:      [id number]   Sex:      F DOB:       March 26, 1923     Age:      38 years  Study Information Authorizing Provider  [phone] SELINDA RUTH Performed Time         03/10/2024 6:43:40 Name                                                          AM Ordering Physician    SELINDA LEE  Patient Location       [facility] Accession Number      [id number] UN   Technologist           Marissa Pachlhofer                                                               RVT Diagnosis:                             Assisting Technologist Ordered Reason For Exam: DVT Indication: tender bruise with lump on the left medial ankle Risk Factors: Trauma (recent fall). Anticoagulation: (Lovenox ). Protocol The major deep veins from the inguinal ligament to the ankle are assessed for bilaterally for compressibility and color and spectral Doppler flow characteristics. The assessed veins include bilateral common femoral vein, femoral vein in the thigh, popliteal vein, and intramuscular calf veins. The iliac vein is assessed indirectly using Doppler waveform analysis. The great saphenous vein is assessed for compressibility at the saphenofemoral junction, and the small saphenous vein assessed for compressibility behind the knee.  Final Interpretation Right There is no evidence of DVT in the lower extremity. There is no evidence of obstruction proximal to the inguinal ligament or in the common femoral vein. Left There is no evidence of DVT in the lower extremity. There is no evidence of obstruction proximal to the inguinal ligament or in the common femoral vein. Possible hematoma in the ankle region (see below)  Electronically signed by [id number] Almeda Dolores MD on 03/10/2024 at 9:40:47 AM.  -------------------------------------------------------------------------------- Right Duplex Findings All veins visualized appear fully compressible. Doppler flow signals demonstrate normal spontaneity, phasicity, and augmentation.  Left Duplex Findings All veins visualized appear fully compressible. Doppler  flow signals demonstrate normal spontaneity, phasicity, and augmentation. Incidental Finding: A non-vascularized, mixed echogenic structure is noted near the medial ankle. Findings may be suggestive of a hematoma. Right Technical Summary No evidence of deep venous obstruction in the lower extremity. No indirect evidence of obstruction proximal to the inguinal ligament. Left Technical Summary No evidence of deep venous obstruction in the lower extremity. No indirect evidence of obstruction proximal to the inguinal ligament. Incidental Finding: A non-vascularized, mixed echogenic structure is noted near the medial ankle. Findings may be suggestive of a hematoma.  Final   CT Chest Wo Contrast Result Date: 03/08/2024 EXAM: CT CHEST WO CONTRAST ACCESSION: [id number] UN REPORT DATE: 03/07/2024 4:27 PM CLINICAL INDICATION: pna, fluid TECHNIQUE: Contiguous noncontrast axial images were reconstructed through the chest following a single breath hold helical acquisition. Images were reformatted in the axial. coronal, and sagittal planes. MIP slabs were also constructed. COMPARISON: Same day prior chest radiograph FINDINGS: LUNGS/AIRWAYS/PLEURA: Biapical calcific pleural parenchymal scarring. Moderate debris in carina proximal right, left mainstem bronchi and multilevel mucoid impacted airways in bilateral lung bases.. Markedly decreased decreased luminal diameter of bilateral mainstem bronchi diffuse apical predominant centrilobular emphysema. Focal nonspecific groundglass opacities in the anterior right upper lobe patchy consolidation with ill-defined centrilobular and tree-in-bud nodules in the lower lobes bilaterally. Small bilateral pleural effusions. No pneumothorax. MEDIASTINUM/THORACIC INLET: No enlarged supraclavicular or intrathoracic lymph nodes. No mediastinal mass or thyroid abnormality. HEART/VASCULATURE: Cardiomegaly. Enlarged left atrium. Mitral and aortic annular calcifications. Moderate calcifications of the  aortic arch and descending thoracic  aorta. Moderate calcifications of the proximal left subclavian artery. Extensive coronary artery atherosclerotic plaque. Aorta is normal in caliber. Dilated main pulmonary artery measures 3.6 cm. UPPER ABDOMEN: Status post cholecystectomy. Atrophic left kidney. Proteinaceous/hemorrhagic left renal cyst. Prominent atherosclerotic disease of the abdominal aorta and its visualized branch vessels. Calcified splenic nodules. CHEST WALL/BONES: No enlarged axillary lymph nodes. Diffuse idiopathic skeletal hyperostosis. Multilevel degenerative degenerative changes of the thoracic spine. DEVICES: None.   Mild diffuse bronchial wall thickening with moderate debris in carina, proximal right, left mainstem bronchi and multilevel mucoid impacted airways in the lower lobes bilaterally. Dependent centrilobular and tree-in-bud nodules in lower lobes; the constellation of these findings are consistent with underlying aspiration pneumonia. Small bilateral pleural effusions with bibasilar atelectasis. Cardiomegaly and dilated main pulmonary artery, which can be seen in the setting of pulmonary hypertension. Additional chronic and incidental findings as detailed in the body of the report.   XR Chest Portable Result Date: 03/07/2024 EXAM: XR CHEST PORTABLE DATE: 03/07/2024 10:33 AM ACCESSION: [id number] UN DICTATED: 03/07/2024 11:50 AM CLINICAL INDICATION: 89 years old Female with COUGH  TECHNIQUE: Portable chest radiograph COMPARISON: Multiple prior chest studies, the most recent from March 04, 2024 FINDINGS: HARDWARE: No indwelling support hardware is evident. HEART &  MEDIASTINUM:  The cardiac silhouette is enlarged. PULMONARY/PLEURAL SPACE:  Opacification associated with the left chest base likely relates to pleural fluid ( small )  and overlying airspace disease, which is favored to be atelectasis. There is minimal, if any, pleural fluid in the right chest. No overt pulmonary edema. No  pneumothorax.  OTHER:  Unremarkable imaging of the cephalad abdomen   Minimal change in portable imaging of the chest relative to recent prior imaging. See comments above.   ECG 12 Lead Result Date: 03/06/2024 SINUS RHYTHM WITH SINUS ARRHYTHMIA WITH OCCASIONAL PREMATURE VENTRICULAR BEATS POSSIBLE ANTERIOR INFARCT  , AGE UNDETERMINED ABNORMAL ECG WHEN COMPARED WITH ECG OF 13-Sep-2023 12:14, SINUS RHYTHM HAS REPLACED JUNCTIONAL RHYTHM T WAVE INVERSION NO LONGER EVIDENT IN INFERIOR LEADS  XR Abdomen 1 View Result Date: 03/05/2024 EXAM: XR ABDOMEN 1 VIEW ACCESSION: [id number] UN REPORT DATE: 03/05/2024 9:16 PM CLINICAL INDICATION: 89 years old with DIARRHEA  COMPARISON: None TECHNIQUE: Supine view of the abdomen, 1 image(s) FINDINGS: Few mildly dilated air-filled loops of presumed small bowel throughout the abdomen. Scattered air within nondilated colon. Partially imaged left hip arthroplasty. External urinary catheter device overlies the right lower quadrant and pelvis. Vascular calcifications. Right upper quadrant surgical clips. Extensive degenerative changes of the spine with mild lumbar dextrocurvature. Lung bases are not well visualized.   Nonspecific bowel gas pattern with few mildly dilated air-filled loops of presumed small bowel throughout the abdomen. Findings could be seen with developing ileus or obstruction.   ______________________________________________________________________ Discharge Instructions:  Activity Instructions     Activity as tolerated            Follow Up instructions and Outpatient Referrals    Call MD for:  persistent nausea or vomiting     Call MD for:  severe uncontrolled pain     Call MD for: Temperature > 38.5 Celsius ( > 101.3 Fahrenheit)     Discharge instructions     Discharge instructions       Appointments which have been scheduled for you    Jun 24, 2024 2:15 PM (Arrive by 2:00 PM) RETURN CONTINUITY with Delon Hussar, MD [facility] GERIATRICS  [place] [facility]) [address] [facility] [address] through [address] [place] [address]  [phone]         ______________________________________________________________________ Discharge Day Services: BP 171/69   Pulse 69   Temp 36.7 C (98.1 F) (Oral)   Resp 18   Ht 157.5 cm (5' 2.01)   Wt 56.5 kg (124 lb 9.6 oz)   SpO2 99%   BMI 22.78 kg/m   Pt seen on the day of discharge and determined appropriate for discharge.  Condition at Discharge: stable  Length of Discharge: I spent greater than 30 mins in the discharge of this patient.

## 2024-05-06 DEATH — deceased
